# Patient Record
Sex: Female | Born: 1955
Health system: Southern US, Community
[De-identification: ages and names within clinical notes are randomized; demographics above are authoritative.]

## PROBLEM LIST (undated history)

## (undated) DIAGNOSIS — B009 Herpesviral infection, unspecified: Secondary | ICD-10-CM

## (undated) DIAGNOSIS — I10 Essential (primary) hypertension: Secondary | ICD-10-CM

## (undated) DIAGNOSIS — E039 Hypothyroidism, unspecified: Secondary | ICD-10-CM

## (undated) DIAGNOSIS — D219 Benign neoplasm of connective and other soft tissue, unspecified: Secondary | ICD-10-CM

## (undated) DIAGNOSIS — R0602 Shortness of breath: Secondary | ICD-10-CM

## (undated) DIAGNOSIS — E785 Hyperlipidemia, unspecified: Secondary | ICD-10-CM

## (undated) DIAGNOSIS — M1990 Unspecified osteoarthritis, unspecified site: Secondary | ICD-10-CM

## (undated) DIAGNOSIS — I219 Acute myocardial infarction, unspecified: Secondary | ICD-10-CM

## (undated) DIAGNOSIS — Z789 Other specified health status: Secondary | ICD-10-CM

## (undated) DIAGNOSIS — E079 Disorder of thyroid, unspecified: Secondary | ICD-10-CM

## (undated) DIAGNOSIS — Z0282 Encounter for adoption services: Secondary | ICD-10-CM

## (undated) DIAGNOSIS — I209 Angina pectoris, unspecified: Secondary | ICD-10-CM

## (undated) HISTORY — DX: Herpesviral infection, unspecified: B00.9

## (undated) HISTORY — DX: Benign neoplasm of connective and other soft tissue, unspecified: D21.9

## (undated) HISTORY — PX: BACK SURGERY: SHX140

## (undated) HISTORY — PX: OOPHORECTOMY: SHX86

## (undated) HISTORY — PX: KNEE SURGERY: SHX244

## (undated) HISTORY — DX: Essential (primary) hypertension: I10

## (undated) HISTORY — DX: Hyperlipidemia, unspecified: E78.5

## (undated) HISTORY — DX: Disorder of thyroid, unspecified: E07.9

## (undated) HISTORY — PX: CARDIAC CATHETERIZATION: SHX172

---

## 1999-09-27 ENCOUNTER — Other Ambulatory Visit: Admission: RE | Admit: 1999-09-27 | Discharge: 1999-09-27 | Payer: Self-pay | Admitting: Geriatric Medicine

## 1999-10-12 ENCOUNTER — Encounter: Payer: Self-pay | Admitting: Internal Medicine

## 1999-10-12 ENCOUNTER — Encounter: Admission: RE | Admit: 1999-10-12 | Discharge: 1999-10-12 | Payer: Self-pay | Admitting: Internal Medicine

## 1999-10-22 ENCOUNTER — Ambulatory Visit (HOSPITAL_COMMUNITY): Admission: RE | Admit: 1999-10-22 | Discharge: 1999-10-22 | Payer: Self-pay | Admitting: Internal Medicine

## 1999-10-22 ENCOUNTER — Encounter: Payer: Self-pay | Admitting: Internal Medicine

## 2002-04-18 ENCOUNTER — Encounter: Admission: RE | Admit: 2002-04-18 | Discharge: 2002-04-18 | Payer: Self-pay | Admitting: Geriatric Medicine

## 2002-04-18 ENCOUNTER — Encounter: Payer: Self-pay | Admitting: Geriatric Medicine

## 2002-10-24 ENCOUNTER — Encounter: Admission: RE | Admit: 2002-10-24 | Discharge: 2002-10-24 | Payer: Self-pay | Admitting: Internal Medicine

## 2002-10-24 ENCOUNTER — Encounter: Payer: Self-pay | Admitting: Anesthesiology

## 2002-11-17 ENCOUNTER — Ambulatory Visit (HOSPITAL_COMMUNITY): Admission: RE | Admit: 2002-11-17 | Discharge: 2002-11-17 | Payer: Self-pay | Admitting: Interventional Cardiology

## 2002-11-17 ENCOUNTER — Encounter: Payer: Self-pay | Admitting: Interventional Cardiology

## 2002-12-04 HISTORY — PX: ABDOMINAL HYSTERECTOMY: SHX81

## 2003-05-16 ENCOUNTER — Encounter: Payer: Self-pay | Admitting: Obstetrics and Gynecology

## 2003-05-16 ENCOUNTER — Encounter (INDEPENDENT_AMBULATORY_CARE_PROVIDER_SITE_OTHER): Payer: Self-pay | Admitting: Specialist

## 2003-05-16 ENCOUNTER — Inpatient Hospital Stay (HOSPITAL_COMMUNITY): Admission: AD | Admit: 2003-05-16 | Discharge: 2003-05-20 | Payer: Self-pay

## 2003-05-16 ENCOUNTER — Encounter (INDEPENDENT_AMBULATORY_CARE_PROVIDER_SITE_OTHER): Payer: Self-pay | Admitting: *Deleted

## 2003-05-16 ENCOUNTER — Encounter: Payer: Self-pay | Admitting: Emergency Medicine

## 2003-08-06 ENCOUNTER — Ambulatory Visit (HOSPITAL_COMMUNITY): Admission: RE | Admit: 2003-08-06 | Discharge: 2003-08-06 | Payer: Self-pay | Admitting: Obstetrics and Gynecology

## 2003-08-06 ENCOUNTER — Encounter: Payer: Self-pay | Admitting: Obstetrics and Gynecology

## 2004-10-12 ENCOUNTER — Other Ambulatory Visit: Admission: RE | Admit: 2004-10-12 | Discharge: 2004-10-12 | Payer: Self-pay | Admitting: Obstetrics and Gynecology

## 2004-12-29 ENCOUNTER — Inpatient Hospital Stay (HOSPITAL_BASED_OUTPATIENT_CLINIC_OR_DEPARTMENT_OTHER): Admission: RE | Admit: 2004-12-29 | Discharge: 2004-12-29 | Payer: Self-pay | Admitting: Interventional Cardiology

## 2005-11-29 ENCOUNTER — Ambulatory Visit (HOSPITAL_COMMUNITY): Admission: RE | Admit: 2005-11-29 | Discharge: 2005-11-29 | Payer: Self-pay | Admitting: Addiction Medicine

## 2005-12-14 ENCOUNTER — Other Ambulatory Visit: Admission: RE | Admit: 2005-12-14 | Discharge: 2005-12-14 | Payer: Self-pay | Admitting: Obstetrics and Gynecology

## 2007-02-08 ENCOUNTER — Inpatient Hospital Stay (HOSPITAL_COMMUNITY): Admission: AD | Admit: 2007-02-08 | Discharge: 2007-02-11 | Payer: Self-pay | Admitting: Interventional Cardiology

## 2007-11-08 ENCOUNTER — Other Ambulatory Visit: Admission: RE | Admit: 2007-11-08 | Discharge: 2007-11-08 | Payer: Self-pay | Admitting: Obstetrics and Gynecology

## 2007-11-29 ENCOUNTER — Encounter: Admission: RE | Admit: 2007-11-29 | Discharge: 2007-11-29 | Payer: Self-pay | Admitting: Obstetrics and Gynecology

## 2010-04-18 ENCOUNTER — Ambulatory Visit: Payer: Self-pay | Admitting: Obstetrics and Gynecology

## 2010-04-18 ENCOUNTER — Other Ambulatory Visit: Admission: RE | Admit: 2010-04-18 | Discharge: 2010-04-18 | Payer: Self-pay | Admitting: Obstetrics and Gynecology

## 2010-06-23 ENCOUNTER — Ambulatory Visit (HOSPITAL_COMMUNITY): Admission: RE | Admit: 2010-06-23 | Discharge: 2010-06-23 | Payer: Self-pay | Admitting: Gastroenterology

## 2011-02-03 ENCOUNTER — Other Ambulatory Visit: Payer: Self-pay | Admitting: Dermatology

## 2011-04-21 NOTE — Cardiovascular Report (Signed)
NAMEPIETRA, ZULUAGA NO.:  [id number]   MEDICAL RECORD NO.:  [id number]          PATIENT TYPE:  INP   LOCATION:  4704                         FACILITY:  [facility]   PHYSICIAN:  Lyn Records, M.D.   DATE OF BIRTH:  08-Jun-1956   DATE OF PROCEDURE:  02/11/2007  DATE OF DISCHARGE:                            CARDIAC CATHETERIZATION   INDICATIONS:  The patient has a history of prior anterior infarction due  to occlusion of diagonal branch treated with angioplasty in 1988.  There  was also a history of recent progressive angina and abnormal stress  test.  The stress test suggests a large area of diminished anterior wall  profusion with ST segment depression consistent with ischemia.  She has  been having chest pain responsive to sublingual nitroglycerin.   PROCEDURES PERFORMED:  1. Left heart cath.  2. Selective coronary angiography.  3. Left ventriculography.   DESCRIPTION OF PROCEDURE:  After informed consent, a 6-French sheath was  placed in the right femoral artery using the modified Seldinger  technique.  A 6-French A2 multipurpose catheter was used for hemodynamic  recordings, left ventriculography by hand injection and selective and  left and right coronary angiography.  Because of intense diffuse  coronary vasoconstriction noted on the first left coronary shot, 200 mcg  of intracoronary nitroglycerin was administered.  The patient tolerated  the procedure without complications.  Manual compression was used for  control of hemostasis.   RESULTS:  1. Hemodynamic data:      a.     Aortic pressure 115/78.      b.     Left ventricular pressure 115/26.   II:  Left ventriculography:  The left ventricle size and function are  normal.  EF is 60%.   III: Coronary angiography:    a.  Left main coronary artery:  Normal.    b.  Left anterior descending coronary artery:  Contains a 50-70%  eccentric mid vessel stenosis and a segmental eccentric  50% distal  stenosis.  The first diagonal, which was the site of previous PCI,  contains 50-60% narrowing.  The entire LAD was in severe diffuse  vasoconstriction prior to 200 mcg of intracoronary nitroglycerin.  After  intracoronary nitroglycerin  the vessel was as described.  Prior to the  nitroglycerin, the region of stenosis in the mid vessel was greater than  80%.    c.  Circumflex artery:  Minimal luminal irregularities.  The obtuse  marginal trifurcates on the left lateral wall.    d.  Right coronary:  The right coronary artery is a large vessel that  gives PDA and left ventricular branches, luminal irregularities are  noted.   CONCLUSIONS:  1. There is severe diffuse vasoconstriction throughout the entire left      coronary system, left anterior descending more involved than      circumflex.  The mid left anterior descending contains an eccentric      50-70% stenosis after intracoronary nitroglycerin.  Prior to the      intracoronary nitroglycerin, the stenosis was greater than 80%.  2. Minimal luminal irregularities noted in  the circumflex and right      coronary.  3. Normal left ventricular function.   PLAN:  1. Add long-acting nitrate therapy.  Intensify Statin therapy.  2. Decreased beta blocker dose.  3. Add calcium channel blocker.  4. If continued chest discomfort, consider flow wire or IVUS or      empiric LAD PCI.      Lyn Records, M.D.  Electronically Signed     HWS/MEDQ  D:  02/11/2007  T:  02/11/2007  Job:  [id number]   cc:   Hal T. Stoneking, M.D.

## 2011-04-21 NOTE — H&P (Signed)
NAMEDRESDEN, AMENT NO.:  [id number]   MEDICAL RECORD NO.:  [id number]          PATIENT TYPE:  INP   LOCATION:  4704                         FACILITY:  [facility]   PHYSICIAN:  Lyn Records, M.D.   DATE OF BIRTH:  08-Oct-1956   DATE OF ADMISSION:  02/08/2007  DATE OF DISCHARGE:                              HISTORY & PHYSICAL   PRIMARY CARE PHYSICIAN:  Dr. Merlene Laughter.   CARDIOLOGIST:  Verdis Prime, MD.   CHIEF COMPLAINT:  Chest pain consistent with unstable angina.   HISTORY OF PRESENT ILLNESS:  [name] is a 55 year old Caucasian  female with a known history of a prior anterior wall MI secondary to a  diagonal one occlusion status post PTCA to the diagonal one  approximately 20 years ago.  She is status post repeat cardiac  catheterization in January 2006 revealing normal LV systolic function  with an EF of 50%.  Irregularities in the right coronary and mid-LAD  diagonal with up to 30-40% narrowing were noted in the proximal left  anterior descending as well as 50% plus/minus 10% narrowing in the  proximal portion of the first diagonal.  Circumflex was normal.  She was  directly admitted to the [facility] on today secondary to a 2-3  weeks history of exertional, substernal chest burning and tightness with  radiation to the bilateral clavicular area.  She denies shortness of  breath and diaphoresis; however, admits to nausea, vomiting, and  dizziness on yesterday.  The pain is relieved with sublingual  nitroglycerin.  On yesterday, she underwent an outpatient stress  Cardiolite at [facility] which was positive for angina and EKG  changes during exercise.  Cardiac catheterization with possible PCI has  been scheduled for Monday to be performed by Dr. Verdis Prime.  The  patient currently admits to chest burning and tightness which is similar  to the quality and character of previous anginal episodes.   PAST MEDICAL HISTORY:  1.  Hypertension.  2. Dyslipidemia.  3. Hypothyroidism.  4. Overactive bladder.  5. History of anterior wall MI secondary to diagonal one occlusion,      status post PTCA to the diagonal one in December of 1988.  6. Status post total abdominal hysterectomy.  7. Status post left knee surgery.   ALLERGIES:  MORPHINE SULFATE.   MEDICATIONS:  1. Synthroid 50 mcg daily.  2. Oxybutynin 15 mg daily.  3. Lipitor 40 mg 1/2 tablet daily.  4. Atenolol 25 mg 1/2 tablet twice daily.  5. Aspirin 325 mg daily.  6. Plavix 75 mg daily.  7. Sublingual nitroglycerin 0.4 mg as needed for chest pain.  8. Estradiol patch weekly.   FAMILY HISTORY:  The patient was adopted, however, has been in contact  with her biological parents and denies any significance for early  coronary artery disease.   SOCIAL HISTORY:  Divorced with two adult children.  Lives alone.  She is  employed as a Occupational psychologist.  She admits to tobacco  use, smoking one pack per day for the past 30 years.  She denies alcohol  and  illicit drug use.   REVIEW OF SYSTEMS:  All other systems reviewed and are negative other  than what is stated in the HPI.   PHYSICAL EXAM:  GENERAL:  A 55 year old female, pleasant and  cooperative, NAD.  VITALS:  Temperature 98.2, blood pressure 155/101, pulse 52,  respirations 20, O2 saturations 98% on room air.  Height 67 inches,  weight 154.9 pounds.  HEENT: Unremarkable.  Neck: Supple without JVD or carotid bruits, bilaterally.  Carotid  upstrokes 2+.  PULMONARY:  Breath sounds are equal and clear to auscultation  bilaterally.  No use of accessory muscles.  CV:  Regular rate and rhythm.  Normal S1-S2 without murmurs, gallops,  clicks or rubs.  ABDOMEN:  Soft, nontender, nondistended with active bowel sounds.  No  masses, organomegaly, or bilateral bruits.  EXTREMITIES:  No peripheral edema.  DP and femoral pulses 2+/2,  bilaterally.  GROIN:  No femoral bruits, bilaterally.   NEURO:  No focal motor or sensory deficits.  PSYCH:  Normal mood and affect.  BACK:  No kyphosis or scoliosis.   LABORATORY DATA:  BMET, CBC, BNP, PT, INR, PTT, magnesium, EKG and  portable chest x-ray are pending.   ASSESSMENT:  1. Chest pain consistent with unstable angina.  2. Prior history of anterior wall myocardial infarction secondary to      diagonal one occlusion status post percutaneous transluminal      coronary angioplasty to the diagonal one in December 1988.  3. Status post cardiac catheterization with mild irregularities, none      greater than 50% January 2006.  4. Hypertension.  5. Dyslipidemia.  6. Hypothyroidism.  7. Overactive bladder.  8. Status post total abdominal hysterectomy.  9. Status post left knee surgery.   PLAN:  1. Rule out MI.  Cardiac panel including troponin-I q.8h. x3.  First      set to be drawn now.  2. Cardiac catheterization scheduled for Monday, February 11, 2007 at      7:30 a.m. to be performed by Dr. Verdis Prime.  The cath procedure,      risks and potential complications have been explained to the      patient in detail by Dr. Katrinka Blazing including CVA, MI, death, IV      contrast dye allergy, renal insufficiency and vascular      complications including bleeding, hematoma and pseudoaneurysm.  The      patient admits to full understanding of the information and wishes      to proceed.  Pre cath orders are set to chart.  3. Daily BMET, CBC and EKG x2.  4. Subcutaneous Lovenox every 12 hours per pharmacy protocol as soon      as possible.  5. Nitroglycerin transdermal patch 0.2 mg per hour per day.  On in the      a.m. and off in the p.m.  Start as soon as possible.  6. If serial cardiac enzymes are positive for troponin I, start IV      Integrilin per pharmacy protocol, single bolus.  7. Change atenolol to 25 mg twice daily with the first dose to be      given now. 8. Fasting lipid panel in a.m.  9. Smoking cessation consult.  10.Continue  home medications with the exception of a change to      atenolol and Lipitor and start with 40 mg daily.  11.Initiate cardiology p.r.n. orders.  A physical backup to wear at      the top  about starting the nitroglycerin transdermal patch is weak      a few days at a sentences a started soon as possible.  12.Add lorazepam 0.5 mg three times daily as needed for anxiety.  13.The patient was seen, interviewed and examined by Dr. Verdis Prime      who participated in the medical decision making and plan of care.      Tylene Fantasia, [place]      Lyn Records, M.D.  Electronically Signed    RDM/MEDQ  D:  02/08/2007  T:  02/09/2007  Job:  [id number]   cc:   Hal T. Stoneking, M.D.

## 2011-04-21 NOTE — Cardiovascular Report (Signed)
NAME:  Sonia Robertson, Sonia Robertson                       ACCOUNT NO.:  [id number]   MEDICAL RECORD NO.:  [id number]                   PATIENT TYPE:  OIB   LOCATION:  2858                                 FACILITY:  [facility]   PHYSICIAN:  Lesleigh Noe, M.D.            DATE OF BIRTH:  1956/04/25   DATE OF PROCEDURE:  11/17/2002  DATE OF DISCHARGE:                              CARDIAC CATHETERIZATION   INDICATIONS FOR PROCEDURE:  Recent recurring episodes of epigastric and  lower substernal discomfort.  Recent Cardiolite demonstrated mild  abnormalities in the mid anterior wall that were fixed.  The patient has a  prior history of myocardial infarction in 1988 with ventricular  fibrillation.  Angioplasty was performed at that time and she has been  relatively asymptomatic since then.   PROCEDURE PERFORMED:  1. Left heart catheterization.  2. Selective coronary angiogram.  3. Left ventriculography.  4. Intravascular ultrasound of the right coronary as entry for the CETP IVUS     study through [facility].   DESCRIPTION OF PROCEDURE:  After informed consent, a 6 French sheath was  started in the right femoral artery using a modified Seldinger technique. A  6 French A2 multipurpose catheter was used for hemodynamic recordings, left  ventriculography by hand injection, and selective left and right coronary  angiography.  No significant high-grade obstruction was found in the LAD,  circumflex or right coronary.  She had consented to participate in the CETP  intravascular ultrasound.  She was enrolled in this study and we chose to  perform intravascular ultrasound on the right coronary. A 6 [place] guide  catheter was used.  A total of 3000 units of IV heparin was administered.  The ACT was documented to be greater than 275 seconds.  A single pass  intravascular ultrasound run was performed using the Scimed Atlantis pro  intravascular ultrasound  catheter.  No complications occurred.  The patient  was taken to the holding area where she will be held until the sheath is  removed after the ACT drops below 175 seconds.   RESULTS:  I:  HEMODYNAMIC DATA:  a.  Aortic pressure 148/76.  b.  Left ventricular pressure 148/13.   II:  LEFT VENTRICULOGRAPHY:  There is mild anterior wall hypokinesis.  Overall LV function is normal.  The EF is 50%.   III:  SELECTIVE CORONARY ANGIOGRAPHY:  a.  Left main coronary artery:  The left main coronary artery is free of any  significant obstruction.  b.  Left anterior descending coronary:  The LAD is a relatively large vessel  that contains rather diffuse proximal and mid vessel irregularities.  There  is 50% narrowing in the LAD beyond the origin of the first diagonal.  Irregularities are noted also in the mid LAD.  The LAD reaches the left  ventricle apex.  The first diagonal is almost a twin size vessel that arises  very proximally from the LAD and also contains luminal irregularities.  No  high-grade obstruction is noted in either the LAD or the diagonal.  c.  Circumflex artery:  The circumflex artery is large.  It gives origin to  three obtuse marginal branches.  Irregularities are noted within the  circumflex and the third obtuse marginal which is the dominant obtuse  marginal vessels but no significant obstruction is seen.  d.  Right coronary:  The right coronary artery is a large vessel that gives  origin to a large PDA and left ventricle branch.  The PDA reaches the left  ventricular apex and slightly wraps around the apex.  Luminal irregularities  are noted in the mid RCA with up to 25-30% narrowing noted.   IV:  INTRAVASCULAR ULTRASOUND OF THE RIGHT CORONARY:  This was performed  without difficulty.  Scattered plaquing was noted throughout the mid and  proximal third of the right coronary which was the region that ultrasound  was performed on.   CONCLUSIONS:  1. Coronary atherosclerotic  heart disease with 25-50% narrowing in the     proximal left anterior descending.  The diagonal, which is the infarct     related vessel from prior infarction 15 years ago is widely patent.  The     right coronary and circumflex contain luminal irregularities.  2. Overall normal left ventricular function with ejection fraction of at     least 55%.  There is mild anterior wall hypokinesis.  3. Successful right coronary intravascular ultrasound as entry criteria for     the CEPT intravascular ultrasound research study at [facility].   PLAN:  Remove sheath later today.  Discharge home later today.  Recent chest  discomfort is not felt to be ischemic. Encourage risk factor modification.                                                 Lesleigh Noe, M.D.    HWS/MEDQ  D:  11/17/2002  T:  11/17/2002  Job:  [id number]   cc:   Hal T. Stoneking, M.D.  [address] [place], [place] [address]  Fax: [phone]

## 2011-04-21 NOTE — H&P (Signed)
NAME:  Sonia Robertson, Sonia Robertson                       ACCOUNT NO.:  [id number]   MEDICAL RECORD NO.:  [id number]                   PATIENT TYPE:  MAT   LOCATION:  MATC                                 FACILITY:  [facility]   PHYSICIAN:  Daniel L. Eda Paschal, M.D.           DATE OF BIRTH:  06/25/1956   DATE OF ADMISSION:  05/16/2003  DATE OF DISCHARGE:                                HISTORY & PHYSICAL   CHIEF COMPLAINT:  Severe pelvic pain.   HISTORY OF PRESENT ILLNESS:  The patient is a 55 year old, gravida 3, para  2, AB 1 who woke up on Thursday morning with severe lower pelvic pain.  She  has used ibuprofen for two days with no results.  The pain is sharp rather  then dull.  It is not aggravated by anything except for sitting.  It is not  made worse by anything else.  She has had no nausea, vomiting, diarrhea.  She has had no change in her bowel habits.  She has had no change in urinary  habits.  Finally this morning after two days of suffering she went to [facility].  She was in so much pain that she was treated with  increments of morphine up to 14 mg IV.  She had a bunch of studies done  including a normal CBC, Comprehensive Metabolic Panel and urinalysis and  then she went ahead and had a CT of her abdomen and pelvis which showed a 9  cm mass slightly to the left of the midline and a little bit of free fluid.  The concern of the radiologist was either a pelvic abscess or possibly an  adnexal mass.  She was transferred to [facility] for more definitive  care as it was felt that her pain was GYN in origin.  At [facility]  she went ahead and had an ultrasound.  Ultrasound showed a heterogenous mass  in the uterus at 7.4 x 5.6 x 6.9 cm, most consistent with a submucous  fibroid.  She also had a discrete exophytic fibroid in the left side of her  uterus that was approximately 5 cm.  She had a normal left ovary which is  the side where she has the most pain and her right  ovary could not be seen.  On CT the right ovary was seen and was normal and it was felt that possibly  her left ovary was normal as well.  The patient has been told by Dr.  Pete Glatter, who is her internist, that she is menopausal.  She had an  elevated FSH eight months ago and has had no bleeding in the last eight  months and is not bleeding now.   PAST MEDICAL HISTORY:  1. Patient had an MI in 29.  She is a heavy smoker.  On the third day of     the MI she underwent angioplasty by Dr. Garnette Scheuermann.  2.  She also has recently been diagnosed with hypothyroidism and is on 25 mcg     of Synthroid.   MEDICATIONS:  1. Synthroid.  2. Lipitor.  She thinks she is on 80 mg as part of a study.  3. In addition, she is on Ditropan XL 50 mg daily for urgency incontinence.   ALLERGIES:  She is allergic to no drugs.   FAMILY HISTORY:  Noncontributory.   SOCIAL HISTORY:  Patient is a heavy smoker.   REVIEW OF SYSTEMS:  HEENT:  Negative.  CARDIAC:  See above.  GI:  Negative.  GU:  See above.  NEUROMUSCULAR:  Negative.  ENDOCRINE:  Negative.   PHYSICAL EXAMINATION:  GENERAL:  Patient is a well-developed, well-nourished  female in acute abdominal discomfort.  She has trouble sitting.  She has  trouble lying flat because of the amount of pain.  VITAL SIGNS:  Her blood pressure is 121/80, her pulse is 55, her  respirations are 18, her temp is 98.5.  HEENT:  All within normal limits.  NECK:  Supple.  Trachea in the midline.  Thyroid is not enlarged.  LUNGS:  Clear to P&A.  HEART:  No thrills, heaves or murmurs.  BREASTS:  No masses.  ABDOMEN:  Reveals diffuse guarding with actual peritoneal rebound most  pronounced in the left lower quadrant.  PELVIC:  Externals within normal limits.  BUS is within normal limits.  Vaginal exam is normal.  Cervix is clean.  Uterus is enlarged by a large  mass of about 15 weeks size with pain mostly on the left side.  Rectovaginal  is negative.  EXTREMITIES:  Within  normal limits.   ADMISSION IMPRESSION:  Pelvic mass with acute abdominal pain, probable  degenerating fibroid.   PLAN:  I think the patient is probably going to need to be explored.  Dr.  Benjaman Kindler is going to come see her to be sure that he does not think there  is a medical explanation for the above and also to be sure that he does not  think she is having an acute MI due to an EKG done today showing a septal  infarct.  Decision will be made after the above.                                                Daniel L. Eda Paschal, M.D.    Tonette Bihari  D:  05/16/2003  T:  05/16/2003  Job:  [id number]

## 2011-04-21 NOTE — Discharge Summary (Signed)
NAMECYLINDA, [name]             ACCOUNT NO.:  [id number]   MEDICAL RECORD NO.:  [id number]          PATIENT TYPE:  INP   LOCATION:  4704                         FACILITY:  [facility]   PHYSICIAN:  Lyn Records, M.D.   DATE OF BIRTH:  1956-08-10   DATE OF ADMISSION:  02/08/2007  DATE OF DISCHARGE:                               DISCHARGE SUMMARY   ADMISSION DIAGNOSIS:  Chest pain consistent with unstable angina.   DISCHARGE DIAGNOSIS:  1. Chest pain consistent with unstable angina status post cardiac      catheterization with nonobstructive coronary arteries possibly      secondary to vasospasm with a 50-70% eccentric mid left anterior      descending.  Normal right coronary artery,  circumflex, and left      main, 02/11/2007.  2. Normal left ventricular systolic function with an ejection fraction      of 60%.  3. Status post acute wall myocardial infarction secondary to diagonal      I no occlusion, status post percutaneous transluminal coronary      angioplasty to the diagonal I in December of 1988.  4. Hypertension.  5. Dyslipidemia.  6. Hypothyroidism.  7. Overactive bladder.  8. Status post total abdominal hysterectomy.  9. Status post left knee surgery.   PROCEDURES:  1. Left heart catheterization.  2. Left ventriculogram.  3. Coronary angiography, 02/11/2007.   CONSULTATION:  None.   HOSPITAL COURSE:  [name] is a 55 year old Caucasian female with  no known history of coronary artery disease.  She is status post an  acute wall MI secondary to diagonal I occlusion status post PTCA to the  diagonal one in 1988.  Repeat cardiac catheterization in 2006 revealed  irregularities in the right coronary and mid LAD diagonal with up to 30-  40% narrowing in the proximal LAD as well as 50%, plus or minus 10%,  narrowing in the proximal portion of the first diagonal.  The circumflex  was normal.  On Friday, 02/08/2007, she was directly admitted to the  [facility]  secondary to a 2-3 week history of exertional chest  discomfort with radiation to the bilateral clavicular area.  The patient  had an abnormal stress test that was positive for angina and EKG changes  during exercise. Point of care enzymes were negative x2 with a peak  troponin of 0.02.  Chest x-ray was negative for acute cardiopulmonary  process.  Fasting lipid panel was normal.  On 02/11/2007, she underwent  a repeat cardiac catheterization that revealed 50-70% eccentric mid LAD  stenosis as well as 50% distal LAD stenosis.  The circumflex, RCA, and  left main were normal.  The patient was noted to have normal LV systolic  function with an EF of 60% via cardiac catheterization.  The procedure  was performed by Dr. Verdis Prime and was well-tolerated by the patient  with an estimated minimal blood loss.  She was observed on bedrest for  four hours with continuation of intravenous hydration.  Her groin was  stable without evidence of bleeding, oozing, hematoma, or  pseudoaneurysm.  She  was discharged to home today in stable condition  without further complaints of angina.  Norvasc 5 mg daily as well as  Imdur 30 mg daily were added to the medical regimen upon discharge.  The  patient was seen and examined by Dr. Verdis Prime who was in agreement  with the patient's discharge on today.   LABORATORY DATA:  Serial cardiac enzymes, CK total 97, 83, and 77, and  CK-MB 3.7, 2.9, and 2.8, respectively.  Troponin-I 0.02 x2 and 0.03.  BNP 40.  PT 13.9, INR 1.1, PTT 41.  White blood count 5.3, hemoglobin  13.2, hematocrit 37.7, platelets 167,000.  Sodium 143, potassium 3.9,  chloride 109, CO2 27, glucose 91, BUN 11, creatinine 0.68, magnesium  2.4.   X-RAYS:  As stated in the hospital course.   EKG:  Twelve-lead EKG, 02/09/2007, revealed marked sinus bradycardia  with a ventricular rate of 44 beats per minute.  There was no evidence  of acute ST-segment/T-wave changes.   CONDITION ON DISCHARGE:   After bedrest, stable.   DISCHARGE MEDICATIONS:  1. Atenolol 25 mg daily.  2. Aspirin 325 mg daily.  3. Plavix 75 mg daily.  4. Lipitor 40 mg 1/2 tablet daily.  5. Sublingual nitroglycerin 0.4 mg as needed for chest pain.  6. Synthroid 50 mcg daily.  7. Norvasc 5 mg daily.  This represents a new prescription.  A      prescription was given with refills.  8. Oxybutynin 15 mg daily.  9. Estradiol patch 0.05 mg weekly.  10.Imdur 30 mg daily.  This represents a new prescription.  A      prescription was given with refills.   DISCHARGE INSTRUCTIONS:  1. No lifting greater than 10 pounds for one week.  2. No driving for one day.  3. Continue a low-sodium, low-fat, and low-cholesterol diet.  4. Avoid straining.  5. Bathe groin area with warm soap and water.  Do not scrub area.  6. Contact physician's office for recurrent chest pain or shortness of      breath.   FOLLOW-UP ARRANGEMENTS:  A groin check has been scheduled with a  physician extender at [facility] on 02/18/2007 at 11:30 a.m..  The  patient is scheduled to call [phone] if she is unable to keep this  scheduled appointment.      Tylene Fantasia, [place]      Lyn Records, M.D.  Electronically Signed    RDM/MEDQ  D:  02/11/2007  T:  02/11/2007  Job:  [id number]   cc:   Lyn Records, M.D.  Hal T. Stoneking, M.D.

## 2011-04-21 NOTE — Discharge Summary (Signed)
   NAME:  Sonia Robertson, Sonia Robertson                       ACCOUNT NO.:  [id number]   MEDICAL RECORD NO.:  [id number]                   PATIENT TYPE:  INP   LOCATION:  9325                                 [facility]   PHYSICIAN:  Daniel L. Eda Paschal, M.D.           DATE OF BIRTH:  [birth year]   DATE OF ADMISSION:  05/16/2003  DATE OF DISCHARGE:  05/20/2003                                 DISCHARGE SUMMARY   DISCHARGE DIAGNOSIS:  Severe pelvic pain.   PROCEDURE:  Total abdominal hysterectomy with bilateral salpingo-  oophorectomy.   HISTORY OF PRESENT ILLNESS:  This is a 55 year old, gravida 3, para 2, AB 1,  who woke up with severe lower abdominal pain that she used ibuprofen for  several days without results or relief.  The pain was so severe she went to  [facility] emergency room where they did a series of laboratories that were normal  and a CT of her abdomen which showed a 9 cm mass slightly to the left of the  midline.  She was transferred to [facility].   An ultrasound showed a heterogenous mass in the uterus 7.4 x 5.6 x 6.9 most  consistent with a fibroid.  She is menopausal, has had no menses in eight  months.   PAST MEDICAL HISTORY:  She did have a MI in 1988, and had angioplasty.  She  is also hypothyroid on Synthroid and is on Lipitor 80 mg daily and Ditropan  XL 50 daily.   HOSPITAL COURSE:  The patient was admitted.  She had a TAH with bilateral  salpingo-oophorectomy.  Findings included a fibroid measuring to 7 cm.  No  evidence of malignancy was found.  Postoperatively, she remained afebrile.  She did have some vaginal bleeding that did resolve.   She was discharged home on postoperative day #4 with instructions to return  to the office for staple removal.   Her urine appeared to be bloody.  It was more from the vagina.  A Foley was  placed.  Clean catch urine and culture were obtained and were negative.  Urine was clear from the Foley.   She was discharged home in  satisfactory condition on her fourth  postoperative day.   LABORATORY DATA:  White count 7.6, hemoglobin 12.1, hematocrit 35.5 and  platelets 172.   DISPOSITION:  The patient was to follow up in the office in three days for  suture removal, staple removal.  A prescription for Tylox p.r.n. was given.  Instructions were reviewed.     Davonna Belling. Young, N.P.                      Daniel L. Eda Paschal, M.D.    Providence Lanius  D:  06/05/2003  T:  06/05/2003  Job:  [id number]

## 2011-04-21 NOTE — Cardiovascular Report (Signed)
NAMEDARRIONA, Sonia Robertson             ACCOUNT NO.:  [id number]   MEDICAL RECORD NO.:  [id number]          PATIENT TYPE:  OIB   LOCATION:  NA                           FACILITY:  [facility]   PHYSICIAN:  Lyn Records III, M.D.DATE OF BIRTH:  [birth year]   DATE OF PROCEDURE:  12/29/2004  DATE OF DISCHARGE:                              CARDIAC CATHETERIZATION   INDICATIONS:  Follow-up study. The patient in to have right coronary  intravascular ultrasound performed.   PROCEDURE PERFORMED:  1.  Left heart cath.  2.  Selective coronary angio.  3.  Left ventriculography.  4.  Right coronary intravascular ultrasound.   DESCRIPTION OF PROCEDURE:  After informed consent, a 6-French sheath was  placed in the right femoral artery using a modified Seldinger technique. A 6-  [place] A2 multipurpose catheter was then used for hemodynamic recordings,  left ventriculography by hand injection, selective left coronary  angiography, and right coronary angiography. We also performed right  coronary angiography with a side-hole Medtronic monitor guide catheter. 100  mcg of intracoronary nitroglycerin was administered prior to coronary  angiography. We then performed intravascular ultrasound on the right  coronary using a 0.014 Zinger medium weight Medtronic wire and a Launcher JR-  4 side-hole catheter. The patient received 5000 units of IV heparin and ACT  was documented to be slightly above 300 before wide manipulation was begun.  An Atlantis probe Sci-Med system was used. Post IVUS, we gave 100 mcg of  intracoronary nitroglycerin and the artery appeared widely patent without  any evidence of dissection or mechanical injury.  Postprocedure, a  sheathogram was performed that demonstrated acceptable anatomy for Angio-  Seal arteriotomy closure. This was performed without consequence.   RESULTS:  1.  HEMODYNAMIC DATA:      1.  Aortic pressure 136/72.      2.  Left ventricle pressure 148/10.  2.  LEFT  VENTRICULOGRAPHY:  Left renal cavity size and systolic function are      normal.  EF is 50% , no MR.  3.  CORONARY ANGIOGRAPHY.      1.  Left main coronary.  The left main coronary artery is normal.      2.  Left anterior descending coronary:  The LAD is large vessel that is          nearly codominant with a large diagonal that also arises from it          proximally. The LAD reaches the left ventricular apex but does not          wrap around.  There are luminal  irregularities noted in proximal          LAD with up to 20 30% narrowing. The diagonal contains proximal and          ostial  50-60% narrowing but no high-grade obstruction.      3.  CIRCUMFLEX ARTERY:  Circumflex is a large trifurcating vessel that          contains minimal luminal  irregularities and also gives arterial          supply  to the ventricular apex. Circumflex is normal.      4.  RIGHT CORONARY:  The right coronary is a dominant vessel.  It gives          origin to a large PDA that comes to the left ventricular apex and          also a large left triple branch. Scattered luminal irregularities          are noted.          1.  INTRAVASCULAR ULTRASOUND:  The Atlantis catheter was placed              distally without difficulty. We began IVUSing just beyond the              ostium of the PDA and the pullback came all the way to the near              ostium of the right coronary.   CONCLUSION:  1.  Irregularities in the right coronary and mild left anterior descending      diagonal disease with up to 30-40% narrowing in the proximal left      anterior descending and 50% plus/minus 10% narrowing in the proximal      portion of the first diagonal. The circumflex is normal.  2.  Overall normal left ventricular function.  3.  Successful Angio-Seal.   PLAN:  Home later today. Continue usual medications, resume Lipitor 80 mg  per day, monitor lab work as OP.      HWS/MEDQ  D:  12/29/2004  T:  12/29/2004  Job:   [id number]   cc:   Hal T. Stoneking, M.D.  [address]  [place], [place] [address]  Fax: [phone]    [facility]

## 2011-04-21 NOTE — Op Note (Signed)
NAME:  Sonia Robertson, Sonia Robertson                       ACCOUNT NO.:  [id number]   MEDICAL RECORD NO.:  [id number]                   PATIENT TYPE:  INP   LOCATION:  9325                                 [facility]ACILITY[facility]   PHYSICIAN:  Daniel L. Eda Paschal, M.D.           DATE OF BIRTH:  [birth year]   DATE OF PROCEDURE:  05/16/2003  DATE OF DISCHARGE:                                 OPERATIVE REPORT   PREOPERATIVE DIAGNOSES:  1. Severe pelvic pain.  2. Degenerative fibroid suspected.   POSTOPERATIVE DIAGNOSIS:  Degenerative hemorrhagic fibroid.   OPERATION:  Total abdominal hysterectomy, bilateral salpingo-oophorectomy.   SURGEON:  Daniel L. Eda Paschal, M.D.   FIRST ASSISTANT:  Ivor Costa. Farrel Gobble, M.D.   FINDINGS:  The patient's uterus was enlarged by two fibroids.  The larger  one was about 7 or 8 cm.  The smaller one was about 4 cm.  The larger one  when you cut into the surface, which the pathologist did, showed hemorrhage  and a degenerated fibroid.  The patient's left ovary and tube were normal.  The ovary was atrophic and postmenopausal.  The patient's right ovary had a  surface lesion that the pathologist felt was a benign papillary adenoma, but  frozen section was not done.  The fallopian tube was normal.  The ovary  itself looked atrophic.  The ileocecal junction was identified.  The  patient's appendix was normal.  The cecum was somewhat distended, as was the  sigmoid, as if the patient had had some large bowel distention.  The small  bowel was run, and there was no Meckel's.  The peritoneal cavity was free of  any other disease.  The omentum was normal.  The liver appeared normal.  There was no peritoneal studding at all.   PROCEDURE:  After adequate general endotracheal anesthesia, the patient was  placed in a supine position and prepped and draped in the usual sterile  manner.  She was given 1 g of Cefotan IV.  Pulsatile stockings were placed.  A Foley catheter was placed.  A  vertical midline incision was made.  The  fascia was opened vertically, as was the peritoneum.  Peritoneal washings  were obtained.  Subcutaneous bleeders were bovied.  The round ligaments were  then grasped, bovied, and cut.  The vesicouterine fold of peritoneum was  developed.  The retroperitoneal space was entered, the ureters were  identified, and the IP ligaments bilaterally were clamped, cut, and doubly  suture ligated with #1 chromic catgut.  The posterior peritoneum was sharply  dissected free.  The uterine arteries were clamped, cut, and doubly suture  ligated with #1 chromic catgut.  The parametrium was taken down in  successive bites with clamping, cutting, and suture ligating with #1 chromic  catgut.  The cervicovaginal junction was identified and entered.  The  uterus, ovaries, and tubes were delivered intact, sent to the pathologist,  who made a diagnosis of  hemorrhagic degenerating fibroid and no malignant  adnexal masses.  The vaginal cuff angles were sutured with #1 chromic  catgut, incorporating the uterosacral ligaments and cardinal ligaments for  good vault support, and then the cuff was whipstitched with 0 Vicryl.  Two  sponge, needle, and instrument counts were correct.  The peritoneal cavity  was copiously irrigated, and then the peritoneum and the fascia were closed  with two running 0 PDS, meeting  in the midline.  The superfascial space was then copiously irrigated and the  skin was closed with staples.  Estimated blood loss for the entire procedure  was 250 mL and none replaced.  The patient tolerated the procedure well and  left the operating room in satisfactory condition draining clear urine from  her Foley catheter.                                               Daniel L. Eda Paschal, M.D.    Tonette Bihari  D:  05/16/2003  T:  05/17/2003  Job:  [id number]

## 2011-08-18 ENCOUNTER — Other Ambulatory Visit: Payer: Self-pay | Admitting: *Deleted

## 2011-08-18 MED ORDER — ESTRADIOL 0.075 MG/24HR TD PTWK: 1.0000 | MEDICATED_PATCH | TRANSDERMAL | Status: DC

## 2011-08-18 NOTE — Telephone Encounter (Signed)
Pt requested a rf on her estradiol patch. She tried to make an appt and was told that Oct was pretty booked and could not schedule due to her availability and told to call back for Nov. She will and Rx taken care of.

## 2011-10-04 ENCOUNTER — Encounter: Payer: Self-pay | Admitting: Gynecology

## 2011-10-04 DIAGNOSIS — D219 Benign neoplasm of connective and other soft tissue, unspecified: Secondary | ICD-10-CM | POA: Insufficient documentation

## 2011-10-13 ENCOUNTER — Ambulatory Visit (INDEPENDENT_AMBULATORY_CARE_PROVIDER_SITE_OTHER): Payer: BC Managed Care – PPO | Admitting: Obstetrics and Gynecology

## 2011-10-13 ENCOUNTER — Other Ambulatory Visit (HOSPITAL_COMMUNITY)
Admission: RE | Admit: 2011-10-13 | Discharge: 2011-10-13 | Disposition: A | Discharge status: Home / Self Care | Payer: BC Managed Care – PPO | Source: Ambulatory Visit | Attending: Obstetrics and Gynecology | Admitting: Obstetrics and Gynecology

## 2011-10-13 ENCOUNTER — Encounter: Payer: Self-pay | Admitting: Obstetrics and Gynecology

## 2011-10-13 VITALS — BP 120/74 | Ht 67.0 in | Wt 166.0 lb

## 2011-10-13 DIAGNOSIS — Z8262 Family history of osteoporosis: Secondary | ICD-10-CM

## 2011-10-13 DIAGNOSIS — Z01419 Encounter for gynecological examination (general) (routine) without abnormal findings: Secondary | ICD-10-CM

## 2011-10-13 DIAGNOSIS — Z780 Asymptomatic menopausal state: Secondary | ICD-10-CM

## 2011-10-13 DIAGNOSIS — N951 Menopausal and female climacteric states: Secondary | ICD-10-CM

## 2011-10-13 MED ORDER — ESTRADIOL 0.075 MG/24HR TD PTWK: 1.0000 | MEDICATED_PATCH | TRANSDERMAL | Status: DC

## 2011-10-13 NOTE — Progress Notes (Signed)
Patient came to see me today for an annual GYN exam. She remains happy on her estrogen patch. We increased the dose last year she says is working much better. She's having no vaginal bleeding. She is having no pelvic pain. She has no vaginal dryness. She is way overdue for mammogram. She's never had a bone density. She suspects her mother had osteoporosis.  HEENT: Within normal limits. Kennon Portela present Neck: No masses. Supraclavicular lymph nodes: Not enlarged. Breasts: Examined in both sitting and lying position. Symmetrical without skin changes or masses. Abdomen: Soft no masses guarding or rebound. No hernias. Pelvic: External within normal limits. BUS within normal limits. Vaginal examination shows good estrogen effect, no cystocele enterocele or rectocele. Cervix and uterus absent. Adnexa within normal limits. Rectovaginal confirmatory. Extremities within normal limits.  Assessment: Normal GYN exam. Menopausal symptoms.  Plan: Continue Climara patch. Mammogram. Bone density. Told her I thought we could just do a breast exam next year.

## 2011-11-22 ENCOUNTER — Ambulatory Visit (INDEPENDENT_AMBULATORY_CARE_PROVIDER_SITE_OTHER): Payer: BC Managed Care – PPO

## 2011-11-22 DIAGNOSIS — Z8262 Family history of osteoporosis: Secondary | ICD-10-CM

## 2011-11-22 DIAGNOSIS — M8580 Other specified disorders of bone density and structure, unspecified site: Secondary | ICD-10-CM

## 2011-11-22 DIAGNOSIS — Z13820 Encounter for screening for osteoporosis: Secondary | ICD-10-CM

## 2011-12-28 ENCOUNTER — Other Ambulatory Visit: Payer: Self-pay | Admitting: *Deleted

## 2011-12-28 MED ORDER — ESTRADIOL 0.075 MG/24HR TD PTWK: 1.0000 | MEDICATED_PATCH | TRANSDERMAL | Status: DC

## 2011-12-28 NOTE — Progress Notes (Signed)
90 day rx request was faxed by [employer].

## 2012-04-09 ENCOUNTER — Other Ambulatory Visit: Payer: Self-pay | Admitting: Neurosurgery

## 2012-04-23 ENCOUNTER — Encounter (HOSPITAL_COMMUNITY): Payer: Self-pay | Admitting: Pharmacy Technician

## 2012-04-24 ENCOUNTER — Inpatient Hospital Stay (HOSPITAL_COMMUNITY): Admission: RE | Admit: 2012-04-24 | Discharge: 2012-04-24 | Payer: BC Managed Care – PPO | Source: Ambulatory Visit

## 2012-04-24 ENCOUNTER — Encounter (HOSPITAL_COMMUNITY): Payer: Self-pay

## 2012-04-24 HISTORY — DX: Hypothyroidism, unspecified: E03.9

## 2012-04-24 HISTORY — DX: Angina pectoris, unspecified: I20.9

## 2012-04-24 HISTORY — DX: Acute myocardial infarction, unspecified: I21.9

## 2012-04-24 HISTORY — DX: Shortness of breath: R06.02

## 2012-04-24 NOTE — Progress Notes (Signed)
REQUESTED STRESS TEST , LAST OFFICE NOTE FROM [facility] DR Katrinka Blazing, [phone].

## 2012-04-24 NOTE — Pre-Procedure Instructions (Signed)
20 Sonia Robertson  04/24/2012   Your procedure is scheduled on:  05/06/12  Report to [facility] at 5:30 AM.  Call this number if you have problems the morning of surgery: [phone]   Remember: Discontinue Aspirin, Coumadin, Plavix, Effient and herbal medications.   Do not eat food:After Midnight.  May have clear liquids: up to 4 Hours before arrival (1:30 AM).  Clear liquids include soda, tea, black coffee, apple or grape juice, broth.  Take these medicines the morning of surgery with A SIP OF WATER: Norvasc/Amlodipine, Atenolol/Tenormin*, Imdur/isosorbide   Do not wear jewelry, make-up or nail polish.  Do not wear lotions, powders, or perfumes. You may wear deodorant.  Do not shave 48 hours prior to surgery. Men may shave face and neck.  Do not bring valuables to the hospital.  Contacts, dentures or bridgework may not be worn into surgery.  Leave suitcase in the car. After surgery it may be brought to your room.  For patients admitted to the hospital, checkout time is 11:00 AM the day of discharge.   Patients discharged the day of surgery will not be allowed to drive home.    Special Instructions: CHG Shower Use Special Wash: 1/2 bottle night before surgery and 1/2 bottle morning of surgery.   Please read over the following fact sheets that you were given: Pain Booklet, Coughing and Deep Breathing, Blood Transfusion Information, MRSA Information and Surgical Site Infection Prevention

## 2012-04-24 NOTE — Progress Notes (Signed)
CONFIRMED WITH PATIENT LAB APPOINTMENT 05/02/12 1540 PM.

## 2012-05-01 NOTE — Consult Note (Addendum)
Anesthesia Chart Review:  Patient is a 56 year old female scheduled for L3-5 laminectomies, resection of left L3-4 synovial cyst, L4-5 PLIF on 05/06/12.  History includes smoking, HTN, hypothyroidism, anterior MI '88 s/p PTCA DIAG1, SOB.  PCP is Dr. Pete Glatter.  Her Cardiologist is Dr. Verdis Prime.  He last saw her on 02/27/12.  EKG then showed SR with PVCs and PACs, nonspecific ST abnormality.  One year follow-up was recommended.  Her last stress test was on 02/07/07 and suggested anterior wall ischemia, EF 66%.  She subsequently had a cardiac cath on 02/11/07 that showed severe diffuse vasoconstriction treated with Nitroglycerin, then normal left main, 50-70% mid LAD, 50% distal LAD, DIAG1 50-60%, minimal luminal irregularities CX and RCA, normal LV function.  He recommended adding  long-acting nitrate and calcium channel blocker therapy, continued statin and beta-blocker therapy, and consideration of empiric LAD PCI if with continued chest discomfort.  She remains on amlodipine, atenolol, atorvastatin, ASA, and isosorbide mononitrate.  I spoke with her via telephone prior to her PAT appointment.  She feels well from a CV standpoint.  She did report that she has to rarely (~ 2-3/year) use a Nitroglycerin tablet for exertional vague chest discomfort, last in March 2013.  She denies any significant SOB or edema.   CXR on 05/02/12 showed biapical scarring. No active disease.   Labs noted..    By Dr. Corky Sing notes, Mrs. Abramovich appeared stable from a CV standpoint.  Due to her 2008 cardiac cath results and Nitro use within the past three months, I called and spoke with Dr. Katrinka Blazing.  He will be in the office on 05/03/12 and will further review her chart and make a final recommendation about her upcoming surgery at that time.  I will be out of the office until 05/06/12, so I will ask our Charge Nurse to follow-up on his recommendations.  I also updated Graciella Belton at Dr. Antony Contras office.    Shonna Chock,  PA-C

## 2012-05-02 ENCOUNTER — Encounter (HOSPITAL_COMMUNITY)
Admission: RE | Admit: 2012-05-02 | Discharge: 2012-05-02 | Disposition: A | Discharge status: Home / Self Care | Payer: BC Managed Care – PPO | Source: Ambulatory Visit | Attending: Neurosurgery | Admitting: Neurosurgery

## 2012-05-02 ENCOUNTER — Ambulatory Visit (HOSPITAL_COMMUNITY)
Admission: RE | Admit: 2012-05-02 | Discharge: 2012-05-02 | Disposition: A | Discharge status: Home / Self Care | Payer: BC Managed Care – PPO | Source: Ambulatory Visit | Attending: Anesthesiology | Admitting: Anesthesiology

## 2012-05-02 DIAGNOSIS — Z01812 Encounter for preprocedural laboratory examination: Secondary | ICD-10-CM | POA: Insufficient documentation

## 2012-05-02 DIAGNOSIS — Z01818 Encounter for other preprocedural examination: Secondary | ICD-10-CM | POA: Insufficient documentation

## 2012-05-02 DIAGNOSIS — I10 Essential (primary) hypertension: Secondary | ICD-10-CM | POA: Insufficient documentation

## 2012-05-02 LAB — SURGICAL PCR SCREEN
MRSA, PCR: NEGATIVE
Staphylococcus aureus: POSITIVE — AB

## 2012-05-02 LAB — CBC
HCT: 43.7 % (ref 36.0–46.0)
Hemoglobin: 15.1 g/dL — ABNORMAL HIGH (ref 12.0–15.0)
MCH: 32.1 pg (ref 26.0–34.0)
MCHC: 34.6 g/dL (ref 30.0–36.0)
MCV: 92.8 fL (ref 78.0–100.0)
Platelets: 143 10*3/uL — ABNORMAL LOW (ref 150–400)
RBC: 4.71 MIL/uL (ref 3.87–5.11)
RDW: 12.9 % (ref 11.5–15.5)
WBC: 6.9 10*3/uL (ref 4.0–10.5)

## 2012-05-02 LAB — BASIC METABOLIC PANEL
BUN: 16 mg/dL (ref 6–23)
CO2: 30 mEq/L (ref 19–32)
Calcium: 9.3 mg/dL (ref 8.4–10.5)
Chloride: 106 mEq/L (ref 96–112)
Creatinine, Ser: 0.71 mg/dL (ref 0.50–1.10)
GFR calc Af Amer: >90 mL/min (ref >90)
GFR calc non Af Amer: >90 mL/min (ref >90)
Glucose, Bld: 92 mg/dL (ref 70–99)
Potassium: 3.7 mEq/L (ref 3.5–5.1)
Sodium: 145 mEq/L (ref 135–145)

## 2012-05-02 LAB — TYPE AND SCREEN
ABO/RH(D): O NEG
Antibody Screen: NEGATIVE

## 2012-05-02 LAB — ABO/RH: ABO/RH(D): O NEG

## 2012-05-03 NOTE — Progress Notes (Signed)
I spoke with Darl Pikes with [facility] care and Dr. Newell Coral, and informed her that I had not heard anything from Dr. [facility] I informed her.that there was nothing in proficient or epic under letters, media or notes regarding recent request from Dr. Katrinka Blazing,  that she would need to follow up, and I will hand off the chart to a later RN in pre admit when it's time for me to leave today.Marland Kitchen

## 2012-05-05 MED ORDER — CEFAZOLIN SODIUM 1-5 GM-% IV SOLN
1.0000 g | INTRAVENOUS | Status: AC
  Administered 2012-05-06 (×2): 1 g via INTRAVENOUS
  Filled 2012-05-05: qty 50

## 2012-05-06 ENCOUNTER — Ambulatory Visit (HOSPITAL_COMMUNITY): Payer: BC Managed Care – PPO | Admitting: Vascular Surgery

## 2012-05-06 ENCOUNTER — Inpatient Hospital Stay (HOSPITAL_COMMUNITY)
Admission: RE | Admit: 2012-05-06 | Discharge: 2012-05-07 | DRG: 756 | Disposition: A | Discharge status: Home / Self Care | Payer: BC Managed Care – PPO | Source: Ambulatory Visit | Attending: Neurosurgery | Admitting: Neurosurgery

## 2012-05-06 ENCOUNTER — Encounter (HOSPITAL_COMMUNITY): Payer: Self-pay | Admitting: Vascular Surgery

## 2012-05-06 ENCOUNTER — Encounter (HOSPITAL_COMMUNITY)
Admission: RE | Disposition: A | Discharge status: Home / Self Care | Payer: Self-pay | Source: Ambulatory Visit | Attending: Neurosurgery

## 2012-05-06 ENCOUNTER — Encounter (HOSPITAL_COMMUNITY): Payer: Self-pay | Admitting: Anesthesiology

## 2012-05-06 ENCOUNTER — Ambulatory Visit (HOSPITAL_COMMUNITY): Payer: BC Managed Care – PPO

## 2012-05-06 DIAGNOSIS — R0602 Shortness of breath: Secondary | ICD-10-CM | POA: Diagnosis present

## 2012-05-06 DIAGNOSIS — M47817 Spondylosis without myelopathy or radiculopathy, lumbosacral region: Principal | ICD-10-CM | POA: Diagnosis present

## 2012-05-06 DIAGNOSIS — I2510 Atherosclerotic heart disease of native coronary artery without angina pectoris: Secondary | ICD-10-CM | POA: Diagnosis present

## 2012-05-06 DIAGNOSIS — F17200 Nicotine dependence, unspecified, uncomplicated: Secondary | ICD-10-CM | POA: Diagnosis present

## 2012-05-06 DIAGNOSIS — E039 Hypothyroidism, unspecified: Secondary | ICD-10-CM | POA: Diagnosis present

## 2012-05-06 DIAGNOSIS — I252 Old myocardial infarction: Secondary | ICD-10-CM

## 2012-05-06 DIAGNOSIS — M4310 Spondylolisthesis, site unspecified: Secondary | ICD-10-CM | POA: Diagnosis present

## 2012-05-06 DIAGNOSIS — I10 Essential (primary) hypertension: Secondary | ICD-10-CM | POA: Diagnosis present

## 2012-05-06 DIAGNOSIS — M7130 Other bursal cyst, unspecified site: Secondary | ICD-10-CM | POA: Diagnosis present

## 2012-05-06 SURGERY — POSTERIOR LUMBAR FUSION 2 LEVEL
Anesthesia: General | Site: Back | Wound class: Clean

## 2012-05-06 MED ORDER — BUPIVACAINE HCL (PF) 0.5 % IJ SOLN
INTRAMUSCULAR | Status: DC | PRN
  Administered 2012-05-06: 15 mL

## 2012-05-06 MED ORDER — EPHEDRINE SULFATE 50 MG/ML IJ SOLN
INTRAMUSCULAR | Status: DC | PRN
  Administered 2012-05-06: 5 mg via INTRAVENOUS
  Administered 2012-05-06: 10 mg via INTRAVENOUS
  Administered 2012-05-06: 5 mg via INTRAVENOUS

## 2012-05-06 MED ORDER — ISOSORBIDE MONONITRATE ER 60 MG PO TB24: 60.0000 mg | ORAL_TABLET | Freq: Every day | ORAL | Status: DC

## 2012-05-06 MED ORDER — PROPOFOL 10 MG/ML IV EMUL
INTRAVENOUS | Status: DC | PRN
  Administered 2012-05-06: 200 mg via INTRAVENOUS

## 2012-05-06 MED ORDER — AMLODIPINE BESYLATE 10 MG PO TABS
10.0000 mg | ORAL_TABLET | Freq: Every day | ORAL | Status: DC
  Filled 2012-05-06: qty 1

## 2012-05-06 MED ORDER — BISACODYL 10 MG RE SUPP: 10.0000 mg | Freq: Every day | RECTAL | Status: DC | PRN

## 2012-05-06 MED ORDER — MAGNESIUM HYDROXIDE 400 MG/5ML PO SUSP: 30.0000 mL | Freq: Every day | ORAL | Status: DC | PRN

## 2012-05-06 MED ORDER — SODIUM CHLORIDE 0.9 % IJ SOLN
3.0000 mL | INTRAMUSCULAR | Status: DC | PRN
  Administered 2012-05-06: 3 mL via INTRAVENOUS

## 2012-05-06 MED ORDER — HYDROXYZINE HCL 50 MG/ML IM SOLN: 50.0000 mg | INTRAMUSCULAR | Status: DC | PRN

## 2012-05-06 MED ORDER — ONDANSETRON HCL 4 MG/2ML IJ SOLN: 4.0000 mg | Freq: Once | INTRAMUSCULAR | Status: DC | PRN

## 2012-05-06 MED ORDER — ATORVASTATIN CALCIUM 20 MG PO TABS
20.0000 mg | ORAL_TABLET | Freq: Every day | ORAL | Status: DC
  Administered 2012-05-06: 20 mg via ORAL
  Filled 2012-05-06 (×2): qty 1

## 2012-05-06 MED ORDER — BACITRACIN 50000 UNITS IM SOLR
INTRAMUSCULAR | Status: AC
  Filled 2012-05-06: qty 1

## 2012-05-06 MED ORDER — ALUM & MAG HYDROXIDE-SIMETH 200-200-20 MG/5ML PO SUSP: 30.0000 mL | Freq: Four times a day (QID) | ORAL | Status: DC | PRN

## 2012-05-06 MED ORDER — ACETAMINOPHEN 10 MG/ML IV SOLN
1000.0000 mg | Freq: Four times a day (QID) | INTRAVENOUS | Status: DC
  Administered 2012-05-06 – 2012-05-07 (×3): 1000 mg via INTRAVENOUS
  Filled 2012-05-06 (×4): qty 100

## 2012-05-06 MED ORDER — HYDROMORPHONE HCL PF 1 MG/ML IJ SOLN: 2.0000 mg | INTRAMUSCULAR | Status: DC | PRN

## 2012-05-06 MED ORDER — KETOROLAC TROMETHAMINE 30 MG/ML IJ SOLN
INTRAMUSCULAR | Status: AC
  Filled 2012-05-06: qty 1

## 2012-05-06 MED ORDER — SODIUM CHLORIDE 0.9 % IR SOLN
Status: DC | PRN
  Administered 2012-05-06: 08:00:00

## 2012-05-06 MED ORDER — MIDAZOLAM HCL 5 MG/5ML IJ SOLN
INTRAMUSCULAR | Status: DC | PRN
  Administered 2012-05-06: 2 mg via INTRAVENOUS

## 2012-05-06 MED ORDER — LEVOTHYROXINE SODIUM 75 MCG PO TABS
75.0000 ug | ORAL_TABLET | Freq: Every day | ORAL | Status: DC
  Filled 2012-05-06 (×3): qty 1

## 2012-05-06 MED ORDER — ESTRADIOL 0.075 MG/24HR TD PTWK: 0.0750 mg | MEDICATED_PATCH | TRANSDERMAL | Status: DC

## 2012-05-06 MED ORDER — ONDANSETRON HCL 4 MG/2ML IJ SOLN
INTRAMUSCULAR | Status: DC | PRN
  Administered 2012-05-06: 4 mg via INTRAVENOUS

## 2012-05-06 MED ORDER — ROCURONIUM BROMIDE 100 MG/10ML IV SOLN
INTRAVENOUS | Status: DC | PRN
  Administered 2012-05-06 (×4): 10 mg via INTRAVENOUS
  Administered 2012-05-06: 40 mg via INTRAVENOUS
  Administered 2012-05-06: 10 mg via INTRAVENOUS

## 2012-05-06 MED ORDER — SODIUM CHLORIDE 0.9 % IJ SOLN: 3.0000 mL | Freq: Two times a day (BID) | INTRAMUSCULAR | Status: DC

## 2012-05-06 MED ORDER — SODIUM CHLORIDE 0.9 % IV SOLN
INTRAVENOUS | Status: DC | PRN
  Administered 2012-05-06: 12:00:00 via INTRAVENOUS

## 2012-05-06 MED ORDER — CYCLOBENZAPRINE HCL 10 MG PO TABS: 10.0000 mg | ORAL_TABLET | Freq: Three times a day (TID) | ORAL | Status: DC | PRN

## 2012-05-06 MED ORDER — KETOROLAC TROMETHAMINE 30 MG/ML IJ SOLN
30.0000 mg | Freq: Four times a day (QID) | INTRAMUSCULAR | Status: DC
  Administered 2012-05-06 – 2012-05-07 (×3): 30 mg via INTRAVENOUS
  Filled 2012-05-06 (×7): qty 1

## 2012-05-06 MED ORDER — SODIUM CHLORIDE 0.9 % IV SOLN
INTRAVENOUS | Status: AC
  Filled 2012-05-06: qty 500

## 2012-05-06 MED ORDER — PHENOL 1.4 % MT LIQD: 1.0000 | OROMUCOSAL | Status: DC | PRN

## 2012-05-06 MED ORDER — PHENYLEPHRINE HCL 10 MG/ML IJ SOLN
10.0000 mg | INTRAVENOUS | Status: DC | PRN
  Administered 2012-05-06: 10 ug/min via INTRAVENOUS

## 2012-05-06 MED ORDER — MENTHOL 3 MG MT LOZG: 1.0000 | LOZENGE | OROMUCOSAL | Status: DC | PRN

## 2012-05-06 MED ORDER — NEOSTIGMINE METHYLSULFATE 1 MG/ML IJ SOLN
INTRAMUSCULAR | Status: DC | PRN
  Administered 2012-05-06: 3 mg via INTRAVENOUS

## 2012-05-06 MED ORDER — THROMBIN 20000 UNITS EX SOLR
CUTANEOUS | Status: DC | PRN
  Administered 2012-05-06: 20000 [IU] via TOPICAL

## 2012-05-06 MED ORDER — HYDROCODONE-ACETAMINOPHEN 5-325 MG PO TABS: 1.0000 | ORAL_TABLET | ORAL | Status: DC | PRN

## 2012-05-06 MED ORDER — LIDOCAINE-EPINEPHRINE 1 %-1:100000 IJ SOLN
INTRAMUSCULAR | Status: DC | PRN
  Administered 2012-05-06: 15 mL

## 2012-05-06 MED ORDER — DEXAMETHASONE SODIUM PHOSPHATE 10 MG/ML IJ SOLN
INTRAMUSCULAR | Status: DC | PRN
  Administered 2012-05-06: 10 mg via INTRAVENOUS

## 2012-05-06 MED ORDER — ISOSORBIDE MONONITRATE ER 60 MG PO TB24
60.0000 mg | ORAL_TABLET | Freq: Every day | ORAL | Status: DC
  Administered 2012-05-06: 60 mg via ORAL
  Filled 2012-05-06 (×2): qty 1

## 2012-05-06 MED ORDER — HYDROXYZINE HCL 25 MG PO TABS: 50.0000 mg | ORAL_TABLET | ORAL | Status: DC | PRN

## 2012-05-06 MED ORDER — HYDROMORPHONE HCL PF 1 MG/ML IJ SOLN: 0.2500 mg | INTRAMUSCULAR | Status: DC | PRN

## 2012-05-06 MED ORDER — GLYCOPYRROLATE 0.2 MG/ML IJ SOLN
INTRAMUSCULAR | Status: DC | PRN
Start: 2012-05-06 — End: 2012-05-06
  Administered 2012-05-06: .4 mg via INTRAVENOUS
  Administered 2012-05-06 (×2): 0.2 mg via INTRAVENOUS

## 2012-05-06 MED ORDER — 0.9 % SODIUM CHLORIDE (POUR BTL) OPTIME
TOPICAL | Status: DC | PRN
  Administered 2012-05-06 (×2): 1000 mL

## 2012-05-06 MED ORDER — ATENOLOL 25 MG PO TABS
25.0000 mg | ORAL_TABLET | Freq: Every day | ORAL | Status: DC
  Filled 2012-05-06: qty 1

## 2012-05-06 MED ORDER — HEMOSTATIC AGENTS (NO CHARGE) OPTIME
TOPICAL | Status: DC | PRN
  Administered 2012-05-06: 1 via TOPICAL

## 2012-05-06 MED ORDER — OXYCODONE-ACETAMINOPHEN 5-325 MG PO TABS: 1.0000 | ORAL_TABLET | ORAL | Status: DC | PRN

## 2012-05-06 MED ORDER — MUPIROCIN 2 % EX OINT
TOPICAL_OINTMENT | Freq: Two times a day (BID) | CUTANEOUS | Status: DC
  Administered 2012-05-06: 22:00:00 via NASAL
  Filled 2012-05-06: qty 22

## 2012-05-06 MED ORDER — KETOROLAC TROMETHAMINE 30 MG/ML IJ SOLN
30.0000 mg | Freq: Once | INTRAMUSCULAR | Status: AC
  Administered 2012-05-06: 30 mg via INTRAVENOUS

## 2012-05-06 MED ORDER — LACTATED RINGERS IV SOLN
INTRAVENOUS | Status: DC | PRN
  Administered 2012-05-06 (×3): via INTRAVENOUS

## 2012-05-06 MED ORDER — CEFAZOLIN SODIUM 1-5 GM-% IV SOLN
INTRAVENOUS | Status: AC
  Filled 2012-05-06: qty 50

## 2012-05-06 MED ORDER — ACETAMINOPHEN 10 MG/ML IV SOLN
INTRAVENOUS | Status: DC | PRN
  Administered 2012-05-06: 1000 mg via INTRAVENOUS

## 2012-05-06 MED ORDER — ACETAMINOPHEN 650 MG RE SUPP: 650.0000 mg | RECTAL | Status: DC | PRN

## 2012-05-06 MED ORDER — LIDOCAINE HCL (CARDIAC) 20 MG/ML IV SOLN
INTRAVENOUS | Status: DC | PRN
  Administered 2012-05-06: 100 mg via INTRAVENOUS

## 2012-05-06 MED ORDER — ZOLPIDEM TARTRATE 5 MG PO TABS: 10.0000 mg | ORAL_TABLET | Freq: Every evening | ORAL | Status: DC | PRN

## 2012-05-06 MED ORDER — KCL IN DEXTROSE-NACL 30-5-0.45 MEQ/L-%-% IV SOLN
INTRAVENOUS | Status: DC
  Administered 2012-05-06: 18:00:00 via INTRAVENOUS
  Filled 2012-05-06 (×6): qty 1000

## 2012-05-06 MED ORDER — ACETAMINOPHEN 10 MG/ML IV SOLN
INTRAVENOUS | Status: AC
  Filled 2012-05-06: qty 100

## 2012-05-06 MED ORDER — DARIFENACIN HYDROBROMIDE ER 15 MG PO TB24
15.0000 mg | ORAL_TABLET | Freq: Every day | ORAL | Status: DC
  Filled 2012-05-06 (×2): qty 1

## 2012-05-06 MED ORDER — ACETAMINOPHEN 325 MG PO TABS: 650.0000 mg | ORAL_TABLET | ORAL | Status: DC | PRN

## 2012-05-06 MED ORDER — FENTANYL CITRATE 0.05 MG/ML IJ SOLN
INTRAMUSCULAR | Status: DC | PRN
  Administered 2012-05-06 (×2): 50 ug via INTRAVENOUS
  Administered 2012-05-06: 150 ug via INTRAVENOUS
  Administered 2012-05-06 (×2): 25 ug via INTRAVENOUS
  Administered 2012-05-06: 50 ug via INTRAVENOUS

## 2012-05-06 SURGICAL SUPPLY — 87 items
BAG DECANTER FOR FLEXI CONT (MISCELLANEOUS) ×2 IMPLANT
BENZOIN TINCTURE PRP APPL 2/3 (GAUZE/BANDAGES/DRESSINGS) IMPLANT
BLADE SURG ROTATE 9660 (MISCELLANEOUS) IMPLANT
BRUSH SCRUB EZ PLAIN DRY (MISCELLANEOUS) ×2 IMPLANT
BUR ACORN 6.0 ACORN (BURR) IMPLANT
BUR ACRON 5.0MM COATED (BURR) ×2 IMPLANT
BUR MATCHSTICK NEURO 3.0 LAGG (BURR) ×2 IMPLANT
CANISTER SUCTION 2500CC (MISCELLANEOUS) ×2 IMPLANT
CAP LCK SPNE (Orthopedic Implant) ×6 IMPLANT
CAP LOCK SPINE RADIUS (Orthopedic Implant) ×6 IMPLANT
CAP LOCKING (Orthopedic Implant) ×6 IMPLANT
CLOTH BEACON ORANGE TIMEOUT ST (SAFETY) ×2 IMPLANT
CONT SPEC 4OZ CLIKSEAL STRL BL (MISCELLANEOUS) ×2 IMPLANT
CONT SPEC STER OR (MISCELLANEOUS) ×2 IMPLANT
COVER BACK TABLE 24X17X13 BIG (DRAPES) IMPLANT
COVER TABLE BACK 60X90 (DRAPES) ×2 IMPLANT
DERMABOND ADVANCED (GAUZE/BANDAGES/DRESSINGS) ×2
DERMABOND ADVANCED .7 DNX12 (GAUZE/BANDAGES/DRESSINGS) ×2 IMPLANT
DRAPE C-ARM 42X72 X-RAY (DRAPES) ×4 IMPLANT
DRAPE LAPAROTOMY 100X72X124 (DRAPES) ×2 IMPLANT
DRAPE POUCH INSTRU U-SHP 10X18 (DRAPES) ×2 IMPLANT
DRAPE PROXIMA HALF (DRAPES) ×2 IMPLANT
DRAPE SURG 17X23 STRL (DRAPES) ×2 IMPLANT
DRESSING TELFA 8X3 (GAUZE/BANDAGES/DRESSINGS) IMPLANT
DRSG EMULSION OIL 3X3 NADH (GAUZE/BANDAGES/DRESSINGS) IMPLANT
DURAPREP 26ML APPLICATOR (WOUND CARE) IMPLANT
ELECT REM PT RETURN 9FT ADLT (ELECTROSURGICAL) ×2
ELECTRODE REM PT RTRN 9FT ADLT (ELECTROSURGICAL) ×1 IMPLANT
GAUZE SPONGE 4X4 16PLY XRAY LF (GAUZE/BANDAGES/DRESSINGS) ×2 IMPLANT
GLOVE BIO SURGEON STRL SZ7.5 (GLOVE) ×2 IMPLANT
GLOVE BIOGEL PI IND STRL 7.0 (GLOVE) ×4 IMPLANT
GLOVE BIOGEL PI IND STRL 8 (GLOVE) ×2 IMPLANT
GLOVE BIOGEL PI INDICATOR 7.0 (GLOVE) ×4
GLOVE BIOGEL PI INDICATOR 8 (GLOVE) ×2
GLOVE ECLIPSE 7.5 STRL STRAW (GLOVE) ×4 IMPLANT
GLOVE EXAM NITRILE LRG STRL (GLOVE) IMPLANT
GLOVE EXAM NITRILE MD LF STRL (GLOVE) ×4 IMPLANT
GLOVE EXAM NITRILE XL STR (GLOVE) IMPLANT
GLOVE EXAM NITRILE XS STR PU (GLOVE) IMPLANT
GLOVE INDICATOR 7.0 STRL GRN (GLOVE) ×4 IMPLANT
GLOVE INDICATOR 7.5 STRL GRN (GLOVE) ×2 IMPLANT
GOWN BRE IMP SLV AUR LG STRL (GOWN DISPOSABLE) IMPLANT
GOWN BRE IMP SLV AUR XL STRL (GOWN DISPOSABLE) ×4 IMPLANT
GOWN STRL REIN 2XL LVL4 (GOWN DISPOSABLE) ×2 IMPLANT
KIT BASIN OR (CUSTOM PROCEDURE TRAY) ×2 IMPLANT
KIT INFUSE SMALL (Orthopedic Implant) ×2 IMPLANT
KIT POSITION SURG JACKSON T1 (MISCELLANEOUS) ×2 IMPLANT
KIT ROOM TURNOVER OR (KITS) ×2 IMPLANT
MILL MEDIUM DISP (BLADE) ×2 IMPLANT
NEEDLE 18GX1X1/2 (RX/OR ONLY) (NEEDLE) ×2 IMPLANT
NEEDLE BONE MARROW 8GAX6 (NEEDLE) ×2 IMPLANT
NEEDLE HYPO 25X1 1.5 SAFETY (NEEDLE) ×2 IMPLANT
NEEDLE SPNL 18GX3.5 QUINCKE PK (NEEDLE) IMPLANT
NEEDLE SPNL 22GX3.5 QUINCKE BK (NEEDLE) ×2 IMPLANT
NS IRRIG 1000ML POUR BTL (IV SOLUTION) ×4 IMPLANT
PACK LAMINECTOMY NEURO (CUSTOM PROCEDURE TRAY) ×2 IMPLANT
PAD ARMBOARD 7.5X6 YLW CONV (MISCELLANEOUS) ×6 IMPLANT
PATTIES SURGICAL .5 X.5 (GAUZE/BANDAGES/DRESSINGS) IMPLANT
PATTIES SURGICAL .5 X1 (DISPOSABLE) IMPLANT
PATTIES SURGICAL 1X1 (DISPOSABLE) IMPLANT
PEEK PLIF AVS 10X25X4 (Peek) ×4 IMPLANT
PEEK PLIF AVS 12X25X4 (Peek) ×4 IMPLANT
ROD 5.5X60MM GREEN (Rod) ×4 IMPLANT
SCREW 5.75X45MM (Screw) ×4 IMPLANT
SCREW 5.75X50MM (Screw) ×8 IMPLANT
SPONGE GAUZE 4X4 12PLY (GAUZE/BANDAGES/DRESSINGS) ×2 IMPLANT
SPONGE LAP 4X18 X RAY DECT (DISPOSABLE) ×2 IMPLANT
SPONGE NEURO XRAY DETECT 1X3 (DISPOSABLE) ×2 IMPLANT
SPONGE SURGIFOAM ABS GEL 100 (HEMOSTASIS) ×2 IMPLANT
STAPLER SKIN PROX WIDE 3.9 (STAPLE) IMPLANT
STRIP BIOACTIVE VITOSS 25X100X (Neuro Prosthesis/Implant) ×4 IMPLANT
STRIP CLOSURE SKIN 1/2X4 (GAUZE/BANDAGES/DRESSINGS) ×2 IMPLANT
SUT PROLENE 6 0 BV (SUTURE) IMPLANT
SUT VIC AB 1 CT1 18XBRD ANBCTR (SUTURE) ×3 IMPLANT
SUT VIC AB 1 CT1 8-18 (SUTURE) ×3
SUT VIC AB 2-0 CP2 18 (SUTURE) ×4 IMPLANT
SYR 20CC LL (SYRINGE) ×2 IMPLANT
SYR 3ML LL SCALE MARK (SYRINGE) IMPLANT
SYR 5ML LL (SYRINGE) IMPLANT
SYR CONTROL 10ML LL (SYRINGE) ×2 IMPLANT
SYR INSULIN 1ML 31GX6 SAFETY (SYRINGE) IMPLANT
TAPE CLOTH SURG 4X10 WHT LF (GAUZE/BANDAGES/DRESSINGS) ×2 IMPLANT
TOWEL OR 17X24 6PK STRL BLUE (TOWEL DISPOSABLE) ×2 IMPLANT
TOWEL OR 17X26 10 PK STRL BLUE (TOWEL DISPOSABLE) ×2 IMPLANT
TRAP SPECIMEN MUCOUS 40CC (MISCELLANEOUS) ×2 IMPLANT
TRAY FOLEY CATH 14FRSI W/METER (CATHETERS) ×2 IMPLANT
WATER STERILE IRR 1000ML POUR (IV SOLUTION) ×2 IMPLANT

## 2012-05-06 NOTE — Preoperative (Signed)
Beta Blockers   Reason not to administer Beta Blockers:Not Applicable 

## 2012-05-06 NOTE — Transfer of Care (Signed)
Immediate Anesthesia Transfer of Care Note  Patient: Sonia Robertson  Procedure(s) Performed: Procedure(s) (LRB): POSTERIOR LUMBAR FUSION 2 LEVEL (N/A)  Patient Location: PACU  Anesthesia Type: General  Level of Consciousness: awake and alert   Airway & Oxygen Therapy: Patient Spontanous Breathing and Patient connected to face mask oxygen  Post-op Assessment: Report given to PACU RN  Post vital signs: Reviewed and stable  Complications: No apparent anesthesia complications

## 2012-05-06 NOTE — H&P (Signed)
Subjective: Patient is a 56 y.o. female who is admitted for treatment of multilevel lumbar degenerative dynamic spondylolisthesis, large L3-4 synovial cyst, and multilevel multifactorial severe lumbar stenosis. Patient's symptoms began over a year ago with gradually increasing pain to the buttocks, posterior thighs, and legs bilaterally, left worse than right. Patient's been treated with end-stage, as well as epidural steroid injections, neither of which have given her much in the way of relief. X-rays show a dynamic grade 1 spinal listhesis of L3 on 4, and dynamic grade 2 spondylosis of L4-5; MRI scan reconfirms the spondylolisthesis with advanced facet arthropathy at both the L3-4 and L4-5 levels, additionally at L3-4 there is a large left sided synovial cyst. Patient is admitted now for L3-L5 decompressive lumbar laminectomy, resection of synovial cyst, bilateral L3-4 and L4-5 posterior lumbar interbody arthrodesis with interbody implants and bone graft and a bilateral L3-L5 posterior lateral arthrodesis with postreduction patient and bone graft.   Patient Active Problem List  Diagnoses Date Noted  . Fibroid    Past Medical History  Diagnosis Date  . Fibroid   . Hypertension   . Thyroid disease   . Myocardial infarction     1988  . Angina     NONE SINCE MARCH   SEES DR Mendel Ryder    . Shortness of breath     WITH EXERTION  . Hypothyroidism     Past Surgical History  Procedure Date  . Abdominal hysterectomy 2004    TAH BSO  . Oophorectomy     BSO  . Knee surgery     Arthroscopic  . Cardiac catheterization     Prescriptions prior to admission  Medication Sig Dispense Refill  . amLODipine (NORVASC) 10 MG tablet Take 10 mg by mouth daily.        Marland Kitchen aspirin 325 MG tablet Take 325 mg by mouth daily.        Marland Kitchen atenolol (TENORMIN) 25 MG tablet Take 25 mg by mouth daily.        Marland Kitchen atorvastatin (LIPITOR) 40 MG tablet Take 20 mg by mouth daily.       . diclofenac (VOLTAREN) 75 MG EC tablet  Take 75 mg by mouth daily as needed. Swelling.      Marland Kitchen estradiol (CLIMARA - DOSED IN MG/24 HR) 0.075 mg/24hr Place 1 patch (0.075 mg total) onto the skin once a week.  12 patch  3  . ibuprofen (ADVIL,MOTRIN) 200 MG tablet Take 800 mg by mouth every 8 (eight) hours as needed. Pain/ swelling.  Only if she has not taken Diclofenac.      . isosorbide mononitrate (IMDUR) 60 MG 24 hr tablet Take 60 mg by mouth daily.        Marland Kitchen levothyroxine (SYNTHROID, LEVOTHROID) 75 MCG tablet Take 75 mcg by mouth daily.        . solifenacin (VESICARE) 10 MG tablet Take 10 mg by mouth daily.         Allergies  Allergen Reactions  . Morphine And Related Nausea And Vomiting    History  Substance Use Topics  . Smoking status: Current Everyday Smoker -- 1.0 packs/day    Types: Cigarettes  . Smokeless tobacco: Not on file  . Alcohol Use: No    Family History  Problem Relation Age of Onset  . Adopted: Yes     Review of Systems A comprehensive review of systems was negative.  Objective: Vital signs in last 24 hours: Temp:  [97.6 F (36.4 C)] 97.6  F (36.4 C) (06/03 0630) Pulse Rate:  [54] 54  (06/03 0630) Resp:  [20] 20  (06/03 0630) BP: (121-141)/(80-93) 121/80 mmHg (06/03 0645) SpO2:  [95 %] 95 % (06/03 0630)  EXAM: Patient is a well-developed well-nourished white female in no acute distress. Lungs are clear to auscultation , the patient has symmetrical respiratory excursion. Heart has a regular rate and rhythm normal S1 and S2 no murmur.   Abdomen is soft nontender nondistended bowel sounds are present. Extremity examination shows no clubbing cyanosis or edema. Musculoskeletal examination shows no tenderness to palpation over the lumbar spinous processes or paralumbar musculature. She is able to flex to 90, extension is limited at less than 5. Straight leg raising is negative bilaterally. Motor examination shows 5 over 5 strength in the lower extremities including the iliopsoas quadriceps dorsiflexor  extensor hallicus  longus and plantar flexor bilaterally. Sensation is intact to pinprick in the distal lower extremities. Reflexes are symmetrical bilaterally. No pathologic reflexes are present. Patient has a normal gait and stance.   Data Review:CBC    Component Value Date/Time   WBC 6.9 05/02/2012 1618   RBC 4.71 05/02/2012 1618   HGB 15.1* 05/02/2012 1618   HCT 43.7 05/02/2012 1618   PLT 143* 05/02/2012 1618   MCV 92.8 05/02/2012 1618   MCH 32.1 05/02/2012 1618   MCHC 34.6 05/02/2012 1618   RDW 12.9 05/02/2012 1618                          BMET    Component Value Date/Time   NA 145 05/02/2012 1618   K 3.7 05/02/2012 1618   CL 106 05/02/2012 1618   CO2 30 05/02/2012 1618   GLUCOSE 92 05/02/2012 1618   BUN 16 05/02/2012 1618   CREATININE 0.71 05/02/2012 1618   CALCIUM 9.3 05/02/2012 1618   GFRNONAA >90 05/02/2012 1618   GFRAA >90 05/02/2012 1618     Assessment/Plan: Patient with advanced degenerative changes at the L3-4 and L4-5 levels as described above. To be admitted for decompression and stabilization at the L3-4 and L4-5 levels as described above. I've discussed with the patient the nature of his condition, the nature the surgical procedure, the typical length of surgery, hospital stay, and overall recuperation, the limitations postoperatively, and risks of surgery. I discussed risks including risks of infection, bleeding, possibly need for transfusion, the risk of nerve root dysfunction with pain, weakness, numbness, or paresthesias, the risk of dural tear and CSF leakage and possible need for further surgery, the risk of failure of the arthrodesis and possibly for further surgery, the risk of anesthetic complications including myocardial infarction, stroke, pneumonia, and death. We discussed the need for postoperative immobilization in a lumbar brace. Understanding all this the patient does wish to proceed with surgery and is admitted for such.     Hewitt Shorts, MD 05/06/2012 7:19  AM

## 2012-05-06 NOTE — Anesthesia Postprocedure Evaluation (Signed)
  Anesthesia Post-op Note  Patient: Sonia Robertson  Procedure(s) Performed: Procedure(s) (LRB): POSTERIOR LUMBAR FUSION 2 LEVEL (N/A)  Patient Location: PACU  Anesthesia Type: General  Level of Consciousness: awake, oriented, sedated and patient cooperative  Airway and Oxygen Therapy: Patient Spontanous Breathing and Patient connected to nasal cannula oxygen  Post-op Pain: mild  Post-op Assessment: Post-op Vital signs reviewed, Patient's Cardiovascular Status Stable, Respiratory Function Stable, Patent Airway, No signs of Nausea or vomiting and Pain level controlled  Post-op Vital Signs: stable  Complications: No apparent anesthesia complications

## 2012-05-06 NOTE — Anesthesia Preprocedure Evaluation (Addendum)
Anesthesia Evaluation  Patient identified by MRN, date of birth, ID band Patient awake    Reviewed: Allergy & Precautions, H&P , NPO status , Patient's Chart, lab work & pertinent test results, reviewed documented beta blocker date and time   Airway Mallampati: II TM Distance: >3 FB Neck ROM: Full    Dental  (+) Teeth Intact   Pulmonary shortness of breath,    Pulmonary exam normal       Cardiovascular hypertension, Pt. on home beta blockers and Pt. on medications + angina with exertion + CAD and + Past MI Rhythm:regular Rate:Normal     Neuro/Psych    GI/Hepatic   Endo/Other  Hypothyroidism   Renal/GU      Musculoskeletal   Abdominal Normal abdominal exam  (+)   Peds  Hematology   Anesthesia Other Findings   Reproductive/Obstetrics                         Anesthesia Physical Anesthesia Plan  ASA: III  Anesthesia Plan: General   Post-op Pain Management:    Induction: Intravenous  Airway Management Planned: Oral ETT  Additional Equipment:   Intra-op Plan:   Post-operative Plan: Extubation in OR  Informed Consent: I have reviewed the patients History and Physical, chart, labs and discussed the procedure including the risks, benefits and alternatives for the proposed anesthesia with the patient or authorized representative who has indicated his/her understanding and acceptance.   Dental advisory given  Plan Discussed with: CRNA, Surgeon and Anesthesiologist  Anesthesia Plan Comments:         Anesthesia Quick Evaluation

## 2012-05-06 NOTE — Progress Notes (Signed)
Filed Vitals:   05/06/12 1315 05/06/12 1330 05/06/12 1335 05/06/12 1400  BP: 120/62 137/57  131/79  Pulse: 52 54 53 77  Temp:   97 F (36.1 C) 97.8 F (36.6 C)  TempSrc:      Resp: 13 13 13 16   SpO2: 96% 95% 95% 93%    Patient resting in bed comfortably. Mild incisional discomfort. Good relief of radicular pain. Dressing clean and dry. Foley to straight drainage, to be removed this afternoon by nursing staff. Moving all 4 extremities well.  Plan: Continues thru postoperative recovery.  Hewitt Shorts, MD 05/06/2012, 3:38 PM

## 2012-05-06 NOTE — Op Note (Signed)
05/06/2012  12:22 PM  PATIENT:  Sonia Robertson  56 y.o. female  PRE-OPERATIVE DIAGNOSIS: Grade 1 L3-4 dynamic degenerative spondylolisthesis, grade 2 L4-5 dynamic degenerative spondylolisthesis, left L3-4 synovial cyst, lumbar stenosis, lumbar spondylosis  POST-OPERATIVE DIAGNOSIS:  Grade 1 L3-4 dynamic degenerative spondylolisthesis, grade 2 L4-5 dynamic degenerative spondylolisthesis, left L3-4 synovial cyst, lumbar stenosis, lumbar spondylosis  PROCEDURE:  Procedure(s): POSTERIOR LUMBAR FUSION 2 LEVEL: L3-L5 decompressive lumbar laminectomy, facetectomy, foraminotomy, with decompression of the exiting L3, L4, and L5 nerve roots bilaterally, with decompression beyond that required for interbody arthrodesis, with resection of left L3-4 synovial cyst, with microdissection and microsurgical technique, bilateral L3-4 and L4-5 posterior lumbar interbody arthrodesis with AVS peek interbody implants and Vitoss BA with bone marrow aspirate and infuse, and bilateral L3-L5 posterolateral arthrodesis with radius posterior instrumentation, Vitoss BA with bone marrow aspirate, infuse, and locally harvested morcellized autograft  SURGEON:  Surgeon(s): Hewitt Shorts, MD Temple Pacini, MD  ASSISTANTS: Julio Sicks, M.D.  ANESTHESIA:   general  EBL:  Total I/O In: 2640 [I.V.:2450; Blood:190] Out: 1400 [Urine:1000; Blood:400]  BLOOD ADMINISTERED:190 CC CELLSAVER  COUNT: Correct per nursing staff  DICTATION: Patient is brought to the operating room placed under general endotracheal anesthesia. The patient was turned to prone position the lumbar region was prepped with Betadine soap and solution and draped in a sterile fashion. The midline was infiltrated with local anesthesia with epinephrine. A midline incision was made carried down thru the subcutaneous tissue, bipolar cautery and electrocautery were used to maintain hemostasis, dissection was carried down to the lumbar fascia. The fascia was incised  bilaterally and the paraspinal muscles were dissected with a spinous process and lamina in a subperiosteal fashion. Another x-ray was taken for localization and the L3-4 and L4-5 levels were localized. Dissection was then carried out laterally over the facet complexes, and the transverse processes of  L3, L4, and L5 were exposed and decorticated. Bilateral L3, L4, and L5 laminectomy, facetectomy, and foraminotomy was performed using double-action rongeurs,  the high-speed drill, and Kerrison punches. Dissection was carried out laterally including facetectomy and foraminotomies with decompression of the L3, L4, and L5 nerve roots. the large left L3-4 synovial cyst was identified, carefully dissected from the dura, with which it had extensive adhesions, he was removed in total.  Once the decompression of the thecal sac and exiting nerve roots was completed we proceeded with the posterior lumbar interbody arthrodesis. The annulus was incised bilaterally at each level and the disc space entered.  A thorough discectomy was performed using pituitary rongeurs and curettes. Once the discectomy was completed we began to prepare the endplate surfaces removing the cartilaginous endplates surface. We then measured the height of the intervertebral disc space. We selected  12 x 25 x 4 at L3-4 and 10 x 25 x 4 at L4-5 AVS peek interbody implants.  The C-arm fluoroscope was then draped and brought in the field and we identified the pedicle entry points bilaterally at the each of the levels. Each of the 6 pedicles was probed, we aspirated bone marrow aspirate from the vertebral bodies this was injected over a 2 10 cc strips of Vitoss BA . Then each of the pedicles was examined with the ball probe good bony surfaces were found and no bony cuts were found. Each of the pedicles was then tapped with a 5.25 mm tap, again examined with the ball probe good threading was found and no bony cuts were found. We then placed 5.75 by  by 50  millimeter screws bilaterally at each of the L3 and L4  levels.at L5 we placed 5.75 x 45 mm screws bilaterally.   We then packed the AVS peek interbody implants with Vitoss BA  with bone marrow aspirate and infuse , and then placed the first implant and on the right sideat each level, carefully retracting the thecal sac and nerve root medially. We then went back to the left side and packed the midline with additional Vitoss BA  with bone marrow aspirate and infuse, and then placed a second implant on the left side again retracting the thecal sac and nerve root medially. Additional Vitoss BA with bone marrow aspirate was packed lateral to the implants at each level bilaterally.  We then packed the lateral gutter over the transverse processes and intertransverse space with Vitoss BA  with bone marrow aspirate, infuse and locally harvested morcellized autograft. We then selected  60 mm pre-lordosed rods, they were placed within the screw heads and secured with locking caps once all 6 locking caps were placed final tightening was performed against a counter torque. a medium variable cross connector was placed at the L3-4 level. He was secured to the rods and then the center tightening was performed.   The wound had been irrigated multiple times during the procedure with saline solution and bacitracin solution, good hemostasis was established with a combination of bipolar cautery and Gelfoam with thrombin. Once good hemostasis was confirmed we proceeded with closure paraspinal muscles deep fascia and Scarpa's fascia were closed with interrupted undyed 1 Vicryl sutures the subcutaneous and subcuticular closed with interrupted inverted 2-0 undyed Vicryl sutures the skin edges were approximated with  Dermabond. A dressing of sterile gauze and Hypafix was applied.  Following surgery the patient was turned back to the supine position to be reversed and the anesthetic extubated and transferred to the recovery room for  further care.   PLAN OF CARE: Admit for overnight observation  PATIENT DISPOSITION:  PACU - hemodynamically stable.   Delay start of Pharmacological VTE agent (>24hrs) due to surgical blood loss or risk of bleeding:  yes

## 2012-05-07 MED ORDER — HYDROCODONE-ACETAMINOPHEN 5-325 MG PO TABS: 1.0000 | ORAL_TABLET | ORAL | Status: AC | PRN

## 2012-05-07 NOTE — Discharge Summary (Signed)
Physician Discharge Summary  Patient ID: Sonia Robertson MRN: [id number] DOB/AGE: Dec 28, 1955 55 y.o.  Admit date: 05/06/2012 Discharge date: 05/07/2012  Admission Diagnoses: L3-4 and L4-5 dynamic degenerative spondylolisthesis, lumbar stenosis, left L3-4 synovial cyst, lumbar spondylosis  Discharge Diagnoses: L3-4 and L4-5 dynamic degenerative spondylolisthesis, lumbar stenosis, left L3-4 synovial cyst, lumbar spondylosis  Discharged Condition: good  Hospital Course:  Patient was admitted underwent an L3-L5 decompressive lumbar laminectomy, resection of her left L3-4 synovial cyst, and a 2 level L3-4 and L4-5 PLIF and PLA. She is done very well postoperatively. She's not required any narcotic medication, but rather has been managed with Toradol and IV acetaminophen. Her wound is healing nicely. She is voiding well since her Foley catheter was removed. She is up and ambulating actively. She's had excellent relief of her radicular pain. She is asking to be discharged home. She's been given instructions regarding wound care and activities following discharge. She is to return to my office for followup in 3 weeks. She asked for a prescription for tramadol (so as to be able to avoid narcotic analgesics) and we have given her prescription for 50 mg tablet, to take 1 or 2 tablets 4 times a day when necessary pain. We prescribed 100 tablets no refills.  Discharge Exam: Blood pressure 110/77, pulse 50, temperature 98.1 F (36.7 C), temperature source Oral, resp. rate 16, SpO2 98.00%.  Disposition:  Home   Medication List  As of 05/07/2012  8:19 AM   TAKE these medications         amLODipine 10 MG tablet   Commonly known as: NORVASC   Take 10 mg by mouth daily.      aspirin 325 MG tablet   Take 325 mg by mouth daily.      atenolol 25 MG tablet   Commonly known as: TENORMIN   Take 25 mg by mouth daily.      atorvastatin 40 MG tablet   Commonly known as: LIPITOR   Take 20 mg by mouth daily.     diclofenac 75 MG EC tablet   Commonly known as: VOLTAREN   Take 75 mg by mouth daily as needed. Swelling.      estradiol 0.075 mg/24hr   Commonly known as: CLIMARA - Dosed in mg/24 hr   Place 1 patch (0.075 mg total) onto the skin once a week.      HYDROcodone-acetaminophen 5-325 MG per tablet   Commonly known as: NORCO   Take 1-2 tablets by mouth every 4 (four) hours as needed for pain.      ibuprofen 200 MG tablet   Commonly known as: ADVIL,MOTRIN   Take 800 mg by mouth every 8 (eight) hours as needed. Pain/ swelling.  Only if she has not taken Diclofenac.      isosorbide mononitrate 60 MG 24 hr tablet   Commonly known as: IMDUR   Take 60 mg by mouth daily.      levothyroxine 75 MCG tablet   Commonly known as: SYNTHROID, LEVOTHROID   Take 75 mcg by mouth daily.      solifenacin 10 MG tablet   Commonly known as: VESICARE   Take 10 mg by mouth daily.             Signed: Hewitt Shorts, MD 05/07/2012, 8:19 AM

## 2012-05-07 NOTE — Plan of Care (Signed)
Problem: Consults Goal: Diagnosis - Spinal Surgery Outcome: Completed/Met Date Met:  05/07/12 Thoraco/Lumbar Spine Fusion     

## 2012-05-07 NOTE — Discharge Instructions (Signed)

## 2012-05-08 MED FILL — Sodium Chloride IV Soln 0.9%: INTRAVENOUS | Qty: 1000 | Status: AC

## 2012-05-08 MED FILL — Sodium Chloride Irrigation Soln 0.9%: Qty: 3000 | Status: AC

## 2012-05-08 MED FILL — Heparin Sodium (Porcine) Inj 1000 Unit/ML: INTRAMUSCULAR | Qty: 30 | Status: AC

## 2012-10-10 ENCOUNTER — Other Ambulatory Visit: Payer: Self-pay | Admitting: Obstetrics and Gynecology

## 2012-10-10 MED ORDER — ESTRADIOL 0.075 MG/24HR TD PTWK: 1.0000 | MEDICATED_PATCH | TRANSDERMAL | Status: DC

## 2012-11-22 ENCOUNTER — Ambulatory Visit (HOSPITAL_BASED_OUTPATIENT_CLINIC_OR_DEPARTMENT_OTHER): Payer: BC Managed Care – PPO | Attending: Otolaryngology

## 2012-11-22 VITALS — Ht 67.0 in | Wt 166.0 lb

## 2012-11-22 DIAGNOSIS — G4733 Obstructive sleep apnea (adult) (pediatric): Secondary | ICD-10-CM | POA: Insufficient documentation

## 2012-11-22 DIAGNOSIS — R259 Unspecified abnormal involuntary movements: Secondary | ICD-10-CM | POA: Insufficient documentation

## 2012-11-23 DIAGNOSIS — G4733 Obstructive sleep apnea (adult) (pediatric): Secondary | ICD-10-CM

## 2012-11-26 ENCOUNTER — Encounter: Payer: Self-pay | Admitting: Obstetrics and Gynecology

## 2012-11-26 ENCOUNTER — Ambulatory Visit (INDEPENDENT_AMBULATORY_CARE_PROVIDER_SITE_OTHER): Payer: BC Managed Care – PPO | Admitting: Obstetrics and Gynecology

## 2012-11-26 VITALS — BP 124/80 | Ht 67.0 in | Wt 170.0 lb

## 2012-11-26 DIAGNOSIS — I10 Essential (primary) hypertension: Secondary | ICD-10-CM | POA: Insufficient documentation

## 2012-11-26 DIAGNOSIS — I219 Acute myocardial infarction, unspecified: Secondary | ICD-10-CM | POA: Insufficient documentation

## 2012-11-26 DIAGNOSIS — Z01419 Encounter for gynecological examination (general) (routine) without abnormal findings: Secondary | ICD-10-CM

## 2012-11-26 DIAGNOSIS — I25119 Atherosclerotic heart disease of native coronary artery with unspecified angina pectoris: Secondary | ICD-10-CM | POA: Insufficient documentation

## 2012-11-26 DIAGNOSIS — E079 Disorder of thyroid, unspecified: Secondary | ICD-10-CM | POA: Insufficient documentation

## 2012-11-26 MED ORDER — ESTRADIOL 0.075 MG/24HR TD PTWK: 1.0000 | MEDICATED_PATCH | TRANSDERMAL | Status: DC

## 2012-11-26 NOTE — Patient Instructions (Signed)
Schedule mammogram.

## 2012-11-26 NOTE — Progress Notes (Signed)
Patient came to see me today for her annual GYN exam. In 2004 she had a total abdominal hysterectomy, bilateral salpingo-oophorectomy for fibroids. She has been on the Climara patch since then. She is having no vaginal or systemic symptoms. She is having no vaginal bleeding. She is having no pelvic pain. She is due for a mammogram. She had normal bone density last year. She has always had normal Pap smears. Her last Pap smear was 2012. She does her lab through her PCP.  HEENT: Within normal limits.Kennon Portela present Neck: No masses. Supraclavicular lymph nodes: Not enlarged. Breasts: Examined in both sitting and lying position. Symmetrical without skin changes or masses. Abdomen: Soft no masses guarding or rebound. No hernias. Pelvic: External within normal limits. BUS within normal limits. Vaginal examination shows good estrogen effect, no cystocele enterocele or rectocele. Cervix and uterus absent. Adnexa within normal limits. Rectovaginal confirmatory. Extremities within normal limits.  Assessment: Normal GYN exam  Plan: Continue Climara patch. Schedule mammogram. Pap not done.The new Pap smear guidelines were discussed with the patient.

## 2012-11-27 LAB — URINALYSIS W MICROSCOPIC + REFLEX CULTURE
Bacteria, UA: NONE SEEN
Bilirubin Urine: NEGATIVE
Casts: NONE SEEN
Crystals: NONE SEEN
Glucose, UA: NEGATIVE mg/dL
Hgb urine dipstick: NEGATIVE
Ketones, ur: NEGATIVE mg/dL
Leukocytes, UA: NEGATIVE
Nitrite: NEGATIVE
Protein, ur: NEGATIVE mg/dL
Specific Gravity, Urine: 1.013 (ref 1.005–1.030)
Squamous Epithelial / LPF: NONE SEEN
Urobilinogen, UA: 0.2 mg/dL (ref 0.0–1.0)
pH: 5.5 (ref 5.0–8.0)

## 2012-12-04 NOTE — Procedures (Signed)
Sonia Robertson, Sonia Robertson                ACCOUNT NO.:  [id number]  MEDICAL RECORD NO.:  [id number]          PATIENT TYPE:  OUT  LOCATION:  SLEEP CENTER                 FACILITY:  [facility]  PHYSICIAN:  Najae Filsaime D. Maple Hudson, MD, FCCP, FACPDATE OF BIRTH:  06-Jan-1956  DATE OF STUDY:  11/22/2012                           NOCTURNAL POLYSOMNOGRAM  REFERRING PHYSICIAN:  Hermelinda Medicus, M.D.  REFERRING PHYSICIAN:  Hermelinda Medicus, MD  INDICATION FOR STUDY:  Hypersomnia with sleep apnea.  EPWORTH SLEEPINESS SCORE:  10/24.  BMI 26, weight 166 pounds, height 67 inches, neck 14 inches.  MEDICATIONS:  Home medications are charted and reviewed.  SLEEP ARCHITECTURE:  Total sleep time 344.5 minutes with sleep efficiency 93.1%.  Stage I at 3.8%, stage II 69.2%, stage III absent, REM 27% of total sleep time.  Sleep latency 4.5 minutes, REM latency 78 minutes.  Awake after sleep onset 21 minutes.  Arousal index 8.9. Bedtime medication:  Aspirin, Lipitor.  RESPIRATORY DATA:  Apnea/hypopnea index (AHI) 15.7 per hour.  A total of 90 events was scored including 63 obstructive apneas, 3 central apneas, 7 mixed apneas, 17 hypopneas.  Events were nonsupine.  REM AHI 25.8 per hour.  The technician indicated there was not enough early sleep to meet protocol requirements for CPAP split titration on this study night.  OXYGEN DATA:  Extremely loud snoring with oxygen desaturation to a nadir of 86% and mean oxygen saturation through the study of 91.2% on room air.  CARDIAC DATA:  Sinus rhythm with frequent PACs.  MOVEMENT/PARASOMNIA:  A total of 81 limb jerks were counted, of which 7 were associated with arousal or awakening for periodic limb movement with arousal index of 1.2 per hour.  Bathroom x1.  IMPRESSION/RECOMMENDATION: 1. Moderate obstructive sleep apnea/hypopnea syndrome, AHI 15.7 per     hour with non-supine events.  Very loud snoring with oxygen     desaturation to a nadir of 86% and mean oxygen saturation  through     the study of 91.2% on room air. 2. The technician indicated the patient did not accumulate enough     sleep to meet protocol requirements in the first part of the study,     so the split protocol CPAP titration was not performed.  Consider     return for dedicated CPAP titration study or evaluate for     alternative management as clinically appropriate. 3. Periodic limb movement with arousal syndrome.  A total of 81 limb     jerks were counted, of which 7 were     associated with arousal or awakening for periodic limb movement     with arousal index of 1.2 per hour.  This can be addressed     clinically if it remains a problem after correction of sleep apnea.     Eldin Bonsell D. Maple Hudson, MD, [facility], FACP Diplomate, American Board of Sleep Medicine    CDY/MEDQ  D:  11/23/2012 13:18:51  T:  11/23/2012 40:98:11  Job:  [id number]

## 2013-02-12 ENCOUNTER — Encounter (HOSPITAL_COMMUNITY): Payer: Self-pay | Admitting: Pharmacy Technician

## 2013-02-19 ENCOUNTER — Encounter (HOSPITAL_COMMUNITY): Payer: BC Managed Care – PPO

## 2013-02-25 ENCOUNTER — Ambulatory Visit (HOSPITAL_BASED_OUTPATIENT_CLINIC_OR_DEPARTMENT_OTHER): Admit: 2013-02-25 | Payer: Self-pay | Admitting: Otolaryngology

## 2013-02-25 ENCOUNTER — Encounter (HOSPITAL_COMMUNITY): Admission: RE | Payer: Self-pay | Source: Ambulatory Visit

## 2013-02-25 ENCOUNTER — Ambulatory Visit (HOSPITAL_COMMUNITY): Admission: RE | Admit: 2013-02-25 | Payer: BC Managed Care – PPO | Source: Ambulatory Visit | Admitting: Otolaryngology

## 2013-02-25 ENCOUNTER — Encounter (HOSPITAL_BASED_OUTPATIENT_CLINIC_OR_DEPARTMENT_OTHER): Payer: Self-pay

## 2013-02-25 SURGERY — UPPP (UVULOPALATOPHARYNGOPLASTY)
Anesthesia: General

## 2013-11-06 ENCOUNTER — Other Ambulatory Visit: Payer: Self-pay | Admitting: Gynecology

## 2013-11-06 DIAGNOSIS — Z1231 Encounter for screening mammogram for malignant neoplasm of breast: Secondary | ICD-10-CM

## 2013-11-19 ENCOUNTER — Ambulatory Visit (HOSPITAL_COMMUNITY)
Admission: RE | Admit: 2013-11-19 | Discharge: 2013-11-19 | Disposition: A | Discharge status: Home / Self Care | Payer: BC Managed Care – PPO | Source: Ambulatory Visit | Attending: Gynecology | Admitting: Gynecology

## 2013-11-19 DIAGNOSIS — Z1231 Encounter for screening mammogram for malignant neoplasm of breast: Secondary | ICD-10-CM | POA: Insufficient documentation

## 2013-11-28 ENCOUNTER — Other Ambulatory Visit: Payer: Self-pay | Admitting: Gynecology

## 2013-11-28 DIAGNOSIS — R928 Other abnormal and inconclusive findings on diagnostic imaging of breast: Secondary | ICD-10-CM

## 2013-12-05 ENCOUNTER — Ambulatory Visit
Admission: RE | Admit: 2013-12-05 | Discharge: 2013-12-05 | Disposition: A | Discharge status: Home / Self Care | Payer: BC Managed Care – PPO | Source: Ambulatory Visit | Attending: Gynecology | Admitting: Gynecology

## 2013-12-05 DIAGNOSIS — R928 Other abnormal and inconclusive findings on diagnostic imaging of breast: Secondary | ICD-10-CM

## 2013-12-11 ENCOUNTER — Encounter: Payer: Self-pay | Admitting: Gynecology

## 2013-12-11 ENCOUNTER — Ambulatory Visit (INDEPENDENT_AMBULATORY_CARE_PROVIDER_SITE_OTHER): Payer: BC Managed Care – PPO | Admitting: Gynecology

## 2013-12-11 VITALS — BP 116/70 | Ht 66.5 in | Wt 171.2 lb

## 2013-12-11 DIAGNOSIS — N951 Menopausal and female climacteric states: Secondary | ICD-10-CM

## 2013-12-11 DIAGNOSIS — F17200 Nicotine dependence, unspecified, uncomplicated: Secondary | ICD-10-CM

## 2013-12-11 DIAGNOSIS — Z780 Asymptomatic menopausal state: Secondary | ICD-10-CM

## 2013-12-11 DIAGNOSIS — Z720 Tobacco use: Secondary | ICD-10-CM | POA: Insufficient documentation

## 2013-12-11 DIAGNOSIS — Z79890 Hormone replacement therapy (postmenopausal): Secondary | ICD-10-CM

## 2013-12-11 DIAGNOSIS — Z01419 Encounter for gynecological examination (general) (routine) without abnormal findings: Secondary | ICD-10-CM

## 2013-12-11 MED ORDER — VARENICLINE TARTRATE 0.5 MG PO TABS: ORAL_TABLET | ORAL | Status: DC

## 2013-12-11 MED ORDER — ESTRADIOL 0.075 MG/24HR TD PTWK: 0.0750 mg | MEDICATED_PATCH | TRANSDERMAL | Status: DC

## 2013-12-11 NOTE — Patient Instructions (Addendum)
Varenicline oral tablets What is this medicine? VARENICLINE (var EN i kleen) is used to help people quit smoking. It can reduce the symptoms caused by stopping smoking. It is used with a patient support program recommended by your physician. This medicine may be used for other purposes; ask your health care provider or pharmacist if you have questions. COMMON BRAND NAME(S): Chantix What should I tell my health care provider before I take this medicine? They need to know if you have any of these conditions: -bipolar disorder, depression, schizophrenia or other mental illness -heart disease -kidney disease -peripheral vascular disease -stroke -suicidal thoughts, plans, or attempt; a previous suicide attempt by you or a family member -an unusual or allergic reaction to varenicline, other medicines, foods, dyes, or preservatives -pregnant or trying to get pregnant -breast-feeding How should I use this medicine? You should set a date to stop smoking and tell your doctor. Start this medicine one week before the quit date. You can also start taking this medicine before you choose a quit date, and then pick a quit date that is between 8 and 35 days of treatment with this medicine. Stick to your plan; ask about support groups or other ways to help you remain a 'quitter'. Take this medicine by mouth after eating. Take with a full glass of water. Follow the directions on the prescription label. Take your doses at regular intervals. Do not take your medicine more often than directed. A special MedGuide will be given to you by the pharmacist with each prescription and refill. Be sure to read this information carefully each time. Talk to your pediatrician regarding the use of this medicine in children. This medicine is not approved for use in children. Overdosage: If you think you have taken too much of this medicine contact a poison control center or emergency room at once. NOTE: This medicine is only for  you. Do not share this medicine with others. What if I miss a dose? If you miss a dose, take it as soon as you can. If it is almost time for your next dose, take only that dose. Do not take double or extra doses. What may interact with this medicine? -insulin -other stop smoking aids -theophylline -warfarin This list may not describe all possible interactions. Give your health care provider a list of all the medicines, herbs, non-prescription drugs, or dietary supplements you use. Also tell them if you smoke, drink alcohol, or use illegal drugs. Some items may interact with your medicine. What should I watch for while using this medicine? Visit your doctor or health care professional for regular check ups. Ask for ongoing advice and encouragement from your doctor or healthcare professional, friends, and family to help you quit. If you smoke while on this medication, quit again Your mouth may get dry. Chewing sugarless gum or sucking hard candy, and drinking plenty of water may help. Contact your doctor if the problem does not go away or is severe. You may get drowsy or dizzy. Do not drive, use machinery, or do anything that needs mental alertness until you know how this medicine affects you. Do not stand or sit up quickly, especially if you are an older patient. This reduces the risk of dizzy or fainting spells. The use of this medicine may increase the chance of suicidal thoughts or actions. Pay special attention to how you are responding while on this medicine. Any worsening of mood, or thoughts of suicide or dying should be reported to your health  care professional right away. What side effects may I notice from receiving this medicine? Side effects that you should report to your doctor or health care professional as soon as possible: -allergic reactions like skin rash, itching or hives, swelling of the face, lips, tongue, or throat -breathing problems -changes in vision -chest pain or chest  tightness -confusion, trouble speaking or understanding -fast, irregular heartbeat -feeling faint or lightheaded, falls -fever -pain in legs when walking -problems with balance, talking, walking -ringing in ears -sudden numbness or weakness of the face, arm or leg -suicidal thoughts or other mood changes -trouble passing urine or change in the amount of urine -unusual bleeding or bruising -unusually weak or tired Side effects that usually do not require medical attention (report to your doctor or health care professional if they continue or are bothersome): -constipation -headache -nausea, vomiting -strange dreams -stomach gas -trouble sleeping This list may not describe all possible side effects. Call your doctor for medical advice about side effects. You may report side effects to FDA at 1-800-FDA-1088. Where should I keep my medicine? Keep out of the reach of children. Store at room temperature between 15 and 30 degrees C (59 and 86 degrees F). Throw away any unused medicine after the expiration date. NOTE: This sheet is a summary. It may not cover all possible information. If you have questions about this medicine, talk to your doctor, pharmacist, or health care provider.  2014, Elsevier/Gold Standard. (2010-06-27 15:12:38) Smoking Cessation Quitting smoking is important to your health and has many advantages. However, it is not always easy to quit since nicotine is a very addictive drug. Often times, people try 3 times or more before being able to quit. This document explains the best ways for you to prepare to quit smoking. Quitting takes hard work and a lot of effort, but you can do it. ADVANTAGES OF QUITTING SMOKING  You will live longer, feel better, and live better.  Your body will feel the impact of quitting smoking almost immediately.  Within 20 minutes, blood pressure decreases. Your pulse returns to its normal level.  After 8 hours, carbon monoxide levels in the  blood return to normal. Your oxygen level increases.  After 24 hours, the chance of having a heart attack starts to decrease. Your breath, hair, and body stop smelling like smoke.  After 48 hours, damaged nerve endings begin to recover. Your sense of taste and smell improve.  After 72 hours, the body is virtually free of nicotine. Your bronchial tubes relax and breathing becomes easier.  After 2 to 12 weeks, lungs can hold more air. Exercise becomes easier and circulation improves.  The risk of having a heart attack, stroke, cancer, or lung disease is greatly reduced.  After 1 year, the risk of coronary heart disease is cut in half.  After 5 years, the risk of stroke falls to the same as a nonsmoker.  After 10 years, the risk of lung cancer is cut in half and the risk of other cancers decreases significantly.  After 15 years, the risk of coronary heart disease drops, usually to the level of a nonsmoker.  If you are pregnant, quitting smoking will improve your chances of having a healthy baby.  The people you live with, especially any children, will be healthier.  You will have extra money to spend on things other than cigarettes. QUESTIONS TO THINK ABOUT BEFORE ATTEMPTING TO QUIT You may want to talk about your answers with your caregiver.  Why do  you want to quit?  If you tried to quit in the past, what helped and what did not?  What will be the most difficult situations for you after you quit? How will you plan to handle them?  Who can help you through the tough times? Your family? Friends? A caregiver?  What pleasures do you get from smoking? What ways can you still get pleasure if you quit? Here are some questions to ask your caregiver:  How can you help me to be successful at quitting?  What medicine do you think would be best for me and how should I take it?  What should I do if I need more help?  What is smoking withdrawal like? How can I get information on  withdrawal? GET READY  Set a quit date.  Change your environment by getting rid of all cigarettes, ashtrays, matches, and lighters in your home, car, or work. Do not let people smoke in your home.  Review your past attempts to quit. Think about what worked and what did not. GET SUPPORT AND ENCOURAGEMENT You have a better chance of being successful if you have help. You can get support in many ways.  Tell your family, friends, and co-workers that you are going to quit and need their support. Ask them not to smoke around you.  Get individual, group, or telephone counseling and support. Programs are available at [employer] and health centers. Call your local health department for information about programs in your area.  Spiritual beliefs and practices may help some smokers quit.  Download a "quit meter" on your computer to keep track of quit statistics, such as how long you have gone without smoking, cigarettes not smoked, and money saved.  Get a self-help book about quitting smoking and staying off of tobacco. [place] yourself from urges to smoke. Talk to someone, go for a walk, or occupy your time with a task.  Change your normal routine. Take a different route to work. Drink tea instead of coffee. Eat breakfast in a different place.  Reduce your stress. Take a hot bath, exercise, or read a book.  Plan something enjoyable to do every day. Reward yourself for not smoking.  Explore interactive web-based programs that specialize in helping you quit. GET MEDICINE AND USE IT CORRECTLY Medicines can help you stop smoking and decrease the urge to smoke. Combining medicine with the above behavioral methods and support can greatly increase your chances of successfully quitting smoking.  Nicotine replacement therapy helps deliver nicotine to your body without the negative effects and risks of smoking. Nicotine replacement therapy includes nicotine gum,  lozenges, inhalers, nasal sprays, and skin patches. Some may be available over-the-counter and others require a prescription.  Antidepressant medicine helps people abstain from smoking, but how this works is unknown. This medicine is available by prescription.  Nicotinic receptor partial agonist medicine simulates the effect of nicotine in your brain. This medicine is available by prescription. Ask your caregiver for advice about which medicines to use and how to use them based on your health history. Your caregiver will tell you what side effects to look out for if you choose to be on a medicine or therapy. Carefully read the information on the package. Do not use any other product containing nicotine while using a nicotine replacement product.  RELAPSE OR DIFFICULT SITUATIONS Most relapses occur within the first 3 months after quitting. Do not be discouraged if you start smoking again.  Remember, most people try several times before finally quitting. You may have symptoms of withdrawal because your body is used to nicotine. You may crave cigarettes, be irritable, feel very hungry, cough often, get headaches, or have difficulty concentrating. The withdrawal symptoms are only temporary. They are strongest when you first quit, but they will go away within 10 14 days. To reduce the chances of relapse, try to:  Avoid drinking alcohol. Drinking lowers your chances of successfully quitting. Reduce the amount of caffeine you consume. Once you quit smoking, the amount of caffeine in your body increases and can give you symptoms, such as a rapid heartbeat, sweating, and anxiety.Shingles Vaccine What You Need to Know WHAT IS SHINGLES?  Shingles is a painful skin rash, often with blisters. It is also called Herpes Zoster or just Zoster.  A shingles rash usually appears on one side of the face or body and lasts from 2 to 4 weeks. Its main symptom is pain, which can be quite severe. Other symptoms of shingles  can include fever, headache, chills, and upset stomach. Very rarely, a shingles infection can lead to pneumonia, hearing problems, blindness, brain inflammation (encephalitis), or death.  For about 1 person in 5, severe pain can continue even after the rash clears up. This is called post-herpetic neuralgia.  Shingles is caused by the Varicella Zoster virus. This is the same virus that causes chickenpox. Only someone who has had a case of chickenpox or rarely, has gotten chickenpox vaccine, can get shingles. The virus stays in your body. It can reappear many years later to cause a case of shingles.  You cannot catch shingles from another person with shingles. However, a person who has never had chickenpox (or chickenpox vaccine) could get chickenpox from someone with shingles. This is not very common.  Shingles is far more common in people 51 and older than in younger people. It is also more common in people whose immune systems are weakened because of a disease such as cancer or drugs such as steroids or chemotherapy.  At least 1 million people get shingles per year in the [place]. SHINGLES VACCINE  A vaccine for shingles was licensed in 6269. In clinical trials, the vaccine reduced the risk of shingles by 50%. It can also reduce the pain in people who still get shingles after being vaccinated.  A single dose of shingles vaccine is recommended for adults 20 years of age and older. SOME PEOPLE SHOULD NOT GET SHINGLES VACCINE OR SHOULD WAIT A person should not get shingles vaccine if he or she:  Has ever had a life-threatening allergic reaction to gelatin, the antibiotic neomycin, or any other component of shingles vaccine. Tell your caregiver if you have any severe allergies.  Has a weakened immune system because of current:  AIDS or another disease that affects the immune system.  Treatment with drugs that affect the immune system, such as prolonged use of high-dose steroids.  Cancer  treatment, such as radiation or chemotherapy.  Cancer affecting the bone marrow or lymphatic system, such as leukemia or lymphoma.  Is pregnant, or might be pregnant. Women should not become pregnant until at least 4 weeks after getting shingles vaccine. Someone with a minor illness, such as a cold, may be vaccinated. Anyone with a moderate or severe acute illness should usually wait until he or she recovers before getting the vaccine. This includes anyone with a temperature of 101.3 F (38 C) or higher. WHAT ARE THE RISKS FROM SHINGLES VACCINE?  A vaccine, like any medicine, could possibly cause serious problems, such as severe allergic reactions. However, the risk of a vaccine causing serious harm, or death, is extremely small.  No serious problems have been identified with shingles vaccine. Mild Problems  Redness, soreness, swelling, or itching at the site of the injection (about 1 person in 3).  Headache (about 1 person in 37). Like all vaccines, shingles vaccine is being closely monitored for unusual or severe problems. WHAT IF THERE IS A MODERATE OR SEVERE REACTION? What should I look for? Any unusual condition, such as a severe allergic reaction or a high fever. If a severe allergic reaction occurred, it would be within a few minutes to an hour after the shot. Signs of a serious allergic reaction can include difficulty breathing, weakness, hoarseness or wheezing, a fast heartbeat, hives, dizziness, paleness, or swelling of the throat. What should I do?  Call your caregiver, or get the person to a caregiver right away.  Tell the caregiver what happened, the date and time it happened, and when the vaccination was given.  Ask the caregiver to report the reaction by filing a Vaccine Adverse Event Reporting System (VAERS) form. Or, you can file this report through the VAERS web site at www.vaers.SamedayNews.es or by calling (830)030-1911. VAERS does not provide medical advice. HOW CAN I  LEARN MORE?  Ask your caregiver. He or she can give you the vaccine package insert or suggest other sources of information.  Contact the Centers for Disease Control and Prevention (CDC):  Call 306-728-9184 (1-800-CDC-INFO).  Visit the CDC website at http://hunter.com/ CDC Shingles Vaccine VIS (09/08/08) Document Released: 09/17/2006 Document Revised: 02/12/2012 Document Reviewed: 03/12/2013 [facility] Patient Information 2014 [place].    Avoid smokers because they can make you want to smoke.  Do not let weight gain distract you. Many smokers will gain weight when they quit, usually less than 10 pounds. Eat a healthy diet and stay active. You can always lose the weight gained after you quit.  Find ways to improve your mood other than smoking. FOR MORE INFORMATION  www.smokefree.gov  Document Released: 11/14/2001 Document Revised: 05/21/2012 Document Reviewed: 02/29/2012 [facility] Patient Information 2014 [place], [place].

## 2013-12-11 NOTE — Progress Notes (Signed)
Sonia Robertson Mar 24, 1956 [id number]   History:    58 y.o.  for annual gyn exam no complaints today.2004 she had a total abdominal hysterectomy, bilateral salpingo-oophorectomy for fibroids. She has been on the Cl[place]ara patch since then. She is having no vaginal or systemic symptoms. She is having no vaginal bleeding. She is having no pelvic pain. Patient is a chronic smoker one pack of cigarettes per day. Patient with no prior history of abnormal Pap smears. She has not received her shingles vaccine but has received the flu vaccine. Her last bone density study was normal in 2012. Patient will be due for a colonoscopy this year. Her primary physician is Dr. Leta Jungling.  Patient has been on ERT since 2004   Past medical history,surgical history, family history and social history were all reviewed and documented in the EPIC chart.  Gynecologic History No LMP recorded. Patient has had a hysterectomy. Contraception: status post hysterectomy Last Pap: 2012. Results were: normal Last mammogram: 2015. Results were: normal  Obstetric History OB History  Gravida Para Term Preterm AB SAB TAB Ectopic Multiple Living  3 2 2  1     2     # Outcome Date GA Lbr Len/2nd Weight Sex Delivery Anes PTL Lv  3 ABT           2 TRM           1 TRM                ROS: A ROS was performed and pertinent positives and negatives are included in the history.  GENERAL: No fevers or chills. HEENT: No change in vision, no earache, sore throat or sinus congestion. NECK: No pain or stiffness. CARDIOVASCULAR: No chest pain or pressure. No palpitations. PULMONARY: No shortness of breath, cough or wheeze. GASTROINTESTINAL: No abdominal pain, nausea, vomiting or diarrhea, melena or bright red blood per rectum. GENITOURINARY: No urinary frequency, urgency, hesitancy or dysuria. MUSCULOSKELETAL: No joint or muscle pain, no back pain, no recent trauma. DERMATOLOGIC: No rash, no itching, no lesions. ENDOCRINE: No polyuria,  polydipsia, no heat or cold intolerance. No recent change in weight. HEMATOLOGICAL: No anemia or easy bruising or bleeding. NEUROLOGIC: No headache, seizures, numbness, tingling or weakness. PSYCHIATRIC: No depression, no loss of interest in normal activity or change in sleep pattern.     Exam: chaperone present  BP 116/70  Ht 5' 6.5" (1.689 m)  Wt 171 lb 3.2 oz (77.656 kg)  BMI 27.22 kg/m2  Body mass index is 27.22 kg/(m^2).  General appearance : Well developed well nourished female. No acute distress HEENT: Neck supple, trachea midline, no carotid bruits, no thyroidmegaly Lungs: Clear to auscultation, no rhonchi or wheezes, or rib retractions  Heart: Regular rate and rhythm, no murmurs or gallops Breast:Examined in sitting and supine position were symmetrical in appearance, no palpable masses or tenderness,  no skin retraction, no nipple inversion, no nipple discharge, no skin discoloration, no axillary or supraclavicular lymphadenopathy Abdomen: no palpable masses or tenderness, no rebound or guarding Extremities: no edema or skin discoloration or tenderness  Pelvic:  Bartholin, Urethra, Skene Glands: Within normal limits             Vagina: No gross lesions or discharge  Cervix: absent  Uterus  Absent  Adnexa  Without masses or tenderness  Anus and perineum  normal   Rectovaginal  normal sphincter tone without palpated masses or tenderness  Hemoccult PCP provides     Assessment/Plan:  58 y.o. female for annual exam who was counseled on the detrimental effects of smoking. Patient several years ago had taken Chantix and would like to try again. She will be prescribed Chantix today risks benefits and pros and cons were discussed. Pap smear not done today the new guidelines were discussed. Prescription for shingles vaccine was provided today. We discussed importance of calcium vitamin D and regular exercise for osteoporosis prevention. She will schedule her next bone  density study in the next few months here in our office. She would need to schedule a colonoscopy for this year.  Note: This dictation was prepared with  Dragon/digital dictation along withSmart phrase technology. Any transcriptional errors that result from this process are unintentional.   Terrance Mass MD, 1:19 PM 12/11/2013

## 2013-12-29 ENCOUNTER — Other Ambulatory Visit: Payer: Self-pay | Admitting: Gynecology

## 2013-12-29 ENCOUNTER — Ambulatory Visit (INDEPENDENT_AMBULATORY_CARE_PROVIDER_SITE_OTHER): Payer: BC Managed Care – PPO

## 2013-12-29 DIAGNOSIS — Z13820 Encounter for screening for osteoporosis: Secondary | ICD-10-CM

## 2013-12-29 DIAGNOSIS — F17200 Nicotine dependence, unspecified, uncomplicated: Secondary | ICD-10-CM

## 2013-12-29 DIAGNOSIS — Z79890 Hormone replacement therapy (postmenopausal): Secondary | ICD-10-CM

## 2013-12-29 DIAGNOSIS — Z780 Asymptomatic menopausal state: Secondary | ICD-10-CM

## 2014-01-23 ENCOUNTER — Other Ambulatory Visit: Payer: Self-pay | Admitting: Interventional Cardiology

## 2014-01-26 ENCOUNTER — Ambulatory Visit: Payer: BC Managed Care – PPO | Admitting: Interventional Cardiology

## 2014-03-09 ENCOUNTER — Other Ambulatory Visit: Payer: Self-pay | Admitting: Interventional Cardiology

## 2014-03-10 ENCOUNTER — Ambulatory Visit (INDEPENDENT_AMBULATORY_CARE_PROVIDER_SITE_OTHER): Payer: BC Managed Care – PPO | Admitting: Interventional Cardiology

## 2014-03-10 ENCOUNTER — Encounter: Payer: Self-pay | Admitting: Interventional Cardiology

## 2014-03-10 VITALS — BP 126/86 | HR 53 | Ht 67.5 in | Wt 173.1 lb

## 2014-03-10 DIAGNOSIS — I209 Angina pectoris, unspecified: Secondary | ICD-10-CM

## 2014-03-10 DIAGNOSIS — F17200 Nicotine dependence, unspecified, uncomplicated: Secondary | ICD-10-CM

## 2014-03-10 DIAGNOSIS — E785 Hyperlipidemia, unspecified: Secondary | ICD-10-CM

## 2014-03-10 DIAGNOSIS — E079 Disorder of thyroid, unspecified: Secondary | ICD-10-CM

## 2014-03-10 DIAGNOSIS — I10 Essential (primary) hypertension: Secondary | ICD-10-CM

## 2014-03-10 NOTE — Patient Instructions (Signed)
Your physician recommends that you continue on your current medications as directed. Please refer to the Current Medication list given to you today.  Your physician recommends that you return for a FASTING lipid profile and alt on 03/12/14 between 7:30am-5:15pm  Your physician wants you to follow-up in: 1 year You will receive a reminder letter in the mail two months in advance. If you don't receive a letter, please call our office to schedule the follow-up appointment.

## 2014-03-10 NOTE — Progress Notes (Signed)
Patient ID: Sonia Robertson, female   DOB: 13-Oct-1956, 58 y.o.   MRN: [id number]    [address], [address] [place], [place]  [address] Phone: [phone] Fax:  [phone]  Date:  03/10/2014   ID:  Sonia Robertson, DOB 07-13-1956, MRN [id number]  PCP:  Mathews Argyle, MD   ASSESSMENT:  1. coronary disease stable. No angina over the mobile 2. Hypertension, controlled 3. Hyperlipidemia not recently assessed  PLAN:  1. Fasting Lipid panel and ALT 2. Clinical followup in one year 3. Active lifestyle   SUBJECTIVE: Sonia Robertson is a 58 y.o. female who is having no cardiovascular complaints. She has not had syncope or chest pain. She denies dyspnea. She continues to smoke. Occasional left hip and groin discomfort but she feels is related to lumbar disc disease. She denies orthopnea, PND, claudication. No transient neurological symptoms.   Wt Readings from Last 3 Encounters:  03/10/14 173 lb 1.9 oz (78.527 kg)  12/11/13 171 lb 3.2 oz (77.656 kg)  11/26/12 170 lb (77.111 kg)     Past Medical History  Diagnosis Date  . Fibroid   . Myocardial infarction     1988  . Angina     NONE SINCE MARCH   SEES DR Linard Millers    . Shortness of breath     WITH EXERTION  . Hypertension   . Thyroid disease   . Hypothyroidism     Current Outpatient Prescriptions  Medication Sig Dispense Refill  . amLODipine (NORVASC) 10 MG tablet Take 10 mg by mouth daily.        Marland Kitchen aspirin 325 MG tablet Take 325 mg by mouth daily.        Marland Kitchen atenolol (TENORMIN) 25 MG tablet Take 25 mg by mouth daily.        Marland Kitchen atorvastatin (LIPITOR) 40 MG tablet Take 20 mg by mouth daily.       . Calcium Carbonate-Vit D-Min (CALCIUM 1200 PO) Take 1 tablet by mouth daily.      . Coenzyme Q10 (COQ10) 200 MG CAPS Take 1 capsule by mouth daily.      Marland Kitchen estradiol (CLIMARA - DOSED IN MG/24 HR) 0.075 mg/24hr patch Place 1 patch (0.075 mg total) onto the skin once a week. Take on sundays  4 patch  11  . ibuprofen (ADVIL,MOTRIN)  200 MG tablet Take 800 mg by mouth every 8 (eight) hours as needed. Pain/ swelling.  Only if she has not taken Diclofenac.      . isosorbide mononitrate (IMDUR) 60 MG 24 hr tablet TAKE 1 BY MOUTH DAILY  90 tablet  0  . levothyroxine (SYNTHROID, LEVOTHROID) 88 MCG tablet Take 88 mcg by mouth daily before breakfast.      . Multiple Vitamin (MULTIVITAMIN WITH MINERALS) TABS Take 1 tablet by mouth daily.      . solifenacin (VESICARE) 10 MG tablet Take 10 mg by mouth daily.        . varenicline (CHANTIX) 0.5 MG tablet Chantix 0.5 mg PO qd x 3 days, then 0.5 mg PO BID x 4 days then 1 mg BID  60 tablet  2  . Vitamin D, Cholecalciferol, 1000 UNITS TABS Take 1 tablet by mouth daily.       No current facility-administered medications for this visit.    Allergies:    Allergies  Allergen Reactions  . Morphine And Related Nausea And Vomiting    Social History:  The patient  reports that she  has been smoking Cigarettes.  She has been smoking about 1.00 pack per day. She does not have any smokeless tobacco history on file. She reports that she does not drink alcohol or use illicit drugs.   ROS:  Please see the history of present illness.   Left hip discomfort   All other systems reviewed and negative.   OBJECTIVE: VS:  BP 126/86  Pulse 53  Ht 5' 7.5" (1.715 m)  Wt 173 lb 1.9 oz (78.527 kg)  BMI 26.70 kg/m2 Well nourished, well developed, in no acute distress, compatible with stated age HEENT: normal Neck: JVD flat. Carotid bruit absent  Cardiac:  normal S1, S2; RRR; no murmur Lungs:  clear to auscultation bilaterally, no wheezing, rhonchi or rales Abd: soft, nontender, no hepatomegaly Ext: Edema absent. Pulses 2+ and symmetric Skin: warm and dry Neuro:  CNs 2-12 intact, no focal abnormalities noted  EKG:  Normal with the exception of QS pattern V1 and V2       Signed, Illene Labrador III, MD 03/10/2014 2:22 PM

## 2014-03-12 ENCOUNTER — Other Ambulatory Visit (INDEPENDENT_AMBULATORY_CARE_PROVIDER_SITE_OTHER): Payer: BC Managed Care – PPO

## 2014-03-12 DIAGNOSIS — E785 Hyperlipidemia, unspecified: Secondary | ICD-10-CM

## 2014-03-12 LAB — LIPID PANEL
Cholesterol: 126 mg/dL (ref 0–200)
HDL: 41.2 mg/dL (ref >39.00)
LDL Cholesterol: 75 mg/dL (ref 0–99)
Total CHOL/HDL Ratio: 3
Triglycerides: 47 mg/dL (ref 0.0–149.0)
VLDL: 9.4 mg/dL (ref 0.0–40.0)

## 2014-03-12 LAB — ALT: ALT: 12 U/L (ref 0–35)

## 2014-03-19 ENCOUNTER — Telehealth: Payer: Self-pay

## 2014-03-19 DIAGNOSIS — E785 Hyperlipidemia, unspecified: Secondary | ICD-10-CM

## 2014-03-19 NOTE — Telephone Encounter (Signed)
lmom.will mail copy of labs to pt and fwd results to pcp

## 2014-03-19 NOTE — Telephone Encounter (Signed)
Message copied by Lamar Laundry on Thu Mar 19, 2014  3:47 PM ------      Message from: Daneen Schick      Created: Thu Mar 12, 2014  4:30 PM       Outstanding. Copy results to PCP. Needs repeat in 1 year with PCP or [place]. ------

## 2014-05-05 ENCOUNTER — Other Ambulatory Visit: Payer: Self-pay | Admitting: Gynecology

## 2014-05-05 DIAGNOSIS — R921 Mammographic calcification found on diagnostic imaging of breast: Secondary | ICD-10-CM

## 2014-05-24 ENCOUNTER — Other Ambulatory Visit: Payer: Self-pay | Admitting: *Deleted

## 2014-05-24 DIAGNOSIS — E785 Hyperlipidemia, unspecified: Secondary | ICD-10-CM

## 2014-05-24 DIAGNOSIS — Z79899 Other long term (current) drug therapy: Secondary | ICD-10-CM

## 2014-06-08 ENCOUNTER — Ambulatory Visit
Admission: RE | Admit: 2014-06-08 | Discharge: 2014-06-08 | Disposition: A | Discharge status: Home / Self Care | Payer: BC Managed Care – PPO | Source: Ambulatory Visit | Attending: Gynecology | Admitting: Gynecology

## 2014-06-08 DIAGNOSIS — R921 Mammographic calcification found on diagnostic imaging of breast: Secondary | ICD-10-CM

## 2014-06-16 ENCOUNTER — Other Ambulatory Visit (INDEPENDENT_AMBULATORY_CARE_PROVIDER_SITE_OTHER): Payer: BC Managed Care – PPO

## 2014-06-16 DIAGNOSIS — E785 Hyperlipidemia, unspecified: Secondary | ICD-10-CM

## 2014-06-16 DIAGNOSIS — Z79899 Other long term (current) drug therapy: Secondary | ICD-10-CM

## 2014-06-16 LAB — LIPID PANEL
Cholesterol: 111 mg/dL (ref 0–200)
HDL: 40.5 mg/dL (ref >39.00)
LDL Cholesterol: 60 mg/dL (ref 0–99)
NonHDL: 70.5
Total CHOL/HDL Ratio: 3
Triglycerides: 54 mg/dL (ref 0.0–149.0)
VLDL: 10.8 mg/dL (ref 0.0–40.0)

## 2014-06-16 LAB — LDL CHOLESTEROL, DIRECT: Direct LDL: 68.6 mg/dL

## 2014-06-16 LAB — ALT: ALT: 16 U/L (ref 0–35)

## 2014-06-26 ENCOUNTER — Telehealth: Payer: Self-pay

## 2014-06-26 NOTE — Telephone Encounter (Signed)
lmom labs normal 

## 2014-07-04 ENCOUNTER — Encounter: Payer: Self-pay | Admitting: Interventional Cardiology

## 2014-08-24 ENCOUNTER — Telehealth: Payer: Self-pay

## 2014-08-24 ENCOUNTER — Other Ambulatory Visit: Payer: Self-pay

## 2014-08-24 MED ORDER — NITROGLYCERIN 0.4 MG SL SUBL: 0.4000 mg | SUBLINGUAL_TABLET | SUBLINGUAL | Status: DC | PRN

## 2014-08-24 NOTE — Telephone Encounter (Signed)
Ok to give pt. Pt had this refilled by Dr Tamala Julian in 2013

## 2014-10-05 ENCOUNTER — Encounter: Payer: Self-pay | Admitting: Interventional Cardiology

## 2014-10-05 ENCOUNTER — Other Ambulatory Visit: Payer: Self-pay | Admitting: Gynecology

## 2014-10-05 DIAGNOSIS — R921 Mammographic calcification found on diagnostic imaging of breast: Secondary | ICD-10-CM

## 2014-11-23 ENCOUNTER — Encounter (INDEPENDENT_AMBULATORY_CARE_PROVIDER_SITE_OTHER): Payer: Self-pay

## 2014-11-23 ENCOUNTER — Ambulatory Visit
Admission: RE | Admit: 2014-11-23 | Discharge: 2014-11-23 | Disposition: A | Discharge status: Home / Self Care | Payer: BC Managed Care – PPO | Source: Ambulatory Visit | Attending: Gynecology | Admitting: Gynecology

## 2014-11-23 DIAGNOSIS — R921 Mammographic calcification found on diagnostic imaging of breast: Secondary | ICD-10-CM

## 2014-12-14 ENCOUNTER — Ambulatory Visit (INDEPENDENT_AMBULATORY_CARE_PROVIDER_SITE_OTHER): Payer: BLUE CROSS/BLUE SHIELD | Admitting: Gynecology

## 2014-12-14 ENCOUNTER — Encounter: Payer: Self-pay | Admitting: Gynecology

## 2014-12-14 VITALS — BP 126/84 | Ht 66.5 in | Wt 175.0 lb

## 2014-12-14 DIAGNOSIS — F17200 Nicotine dependence, unspecified, uncomplicated: Secondary | ICD-10-CM

## 2014-12-14 DIAGNOSIS — N3281 Overactive bladder: Secondary | ICD-10-CM

## 2014-12-14 DIAGNOSIS — Z780 Asymptomatic menopausal state: Secondary | ICD-10-CM

## 2014-12-14 DIAGNOSIS — Z79890 Hormone replacement therapy (postmenopausal): Secondary | ICD-10-CM

## 2014-12-14 DIAGNOSIS — Z01419 Encounter for gynecological examination (general) (routine) without abnormal findings: Secondary | ICD-10-CM

## 2014-12-14 DIAGNOSIS — Z720 Tobacco use: Secondary | ICD-10-CM

## 2014-12-14 MED ORDER — ESTRADIOL 0.05 MG/24HR TD PTWK: 0.0500 mg | MEDICATED_PATCH | TRANSDERMAL | Status: DC

## 2014-12-14 MED ORDER — MIRABEGRON ER 50 MG PO TB24: 50.0000 mg | ORAL_TABLET | Freq: Every day | ORAL | Status: DC

## 2014-12-14 NOTE — Progress Notes (Signed)
Sonia Robertson Apr 07, 1956 [id number]   History:    59 y.o.  for annual gyn exam with no complaints today. Patient continues to smoke a pack a day despite having been counseled in the past. She is on Climara transdermal patch 0.075 every weekly. Review of her record indicated in 2004 she had a total abdominal hysterectomy with bilateral salpingo-oophorectomy for symptomatic uterine fibroids. Patient with no past history of any abnormal Pap smears. Her shingles vaccine is up-to-date. Her last bone density study was normal in 2015. She states she is taking her calcium and vitamin D. Her primary care physician is been doing her blood work Dr. Felipa Eth. Her colonoscopy was in 2011 she states that benign polyps were removed. Patient also has suffer from overactive bladder and had been on Vesicare 10 mg daily and is states it is not helping her much and she's had eye irritation and dryness while she has been on it. She's been followed by her ophthalmologist. Also the Vesicare is too costly  Past medical history,surgical history, family history and social history were all reviewed and documented in the EPIC chart.  Gynecologic History No LMP recorded. Patient has had a hysterectomy. Contraception: status post hysterectomy Last Pap: 2012. Results were: normal Last mammogram: December 2015. Results were: Normal but dense breasts  Obstetric History OB History  Gravida Para Term Preterm AB SAB TAB Ectopic Multiple Living  3 2 2  1     2     # Outcome Date GA Lbr Len/2nd Weight Sex Delivery Anes PTL Lv  3 AB           2 Term           1 Term                ROS: A ROS was performed and pertinent positives and negatives are included in the history.  GENERAL: No fevers or chills. HEENT: No change in vision, no earache, sore throat or sinus congestion. NECK: No pain or stiffness. CARDIOVASCULAR: No chest pain or pressure. No palpitations. PULMONARY: No shortness of breath, cough or wheeze.  GASTROINTESTINAL: No abdominal pain, nausea, vomiting or diarrhea, melena or bright red blood per rectum. GENITOURINARY: No urinary frequency, urgency, hesitancy or dysuria. MUSCULOSKELETAL: No joint or muscle pain, no back pain, no recent trauma. DERMATOLOGIC: No rash, no itching, no lesions. ENDOCRINE: No polyuria, polydipsia, no heat or cold intolerance. No recent change in weight. HEMATOLOGICAL: No anemia or easy bruising or bleeding. NEUROLOGIC: No headache, seizures, numbness, tingling or weakness. PSYCHIATRIC: No depression, no loss of interest in normal activity or change in sleep pattern.     Exam: chaperone present  BP 126/84 mmHg  Ht 5' 6.5" (1.689 m)  Wt 175 lb (79.379 kg)  BMI 27.83 kg/m2  Body mass index is 27.83 kg/(m^2).  General appearance : Well developed well nourished female. No acute distress HEENT: Neck supple, trachea midline, no carotid bruits, no thyroidmegaly Lungs: Clear to auscultation, no rhonchi or wheezes, or rib retractions  Heart: Regular rate and rhythm, no murmurs or gallops Breast:Examined in sitting and supine position were symmetrical in appearance, no palpable masses or tenderness,  no skin retraction, no nipple inversion, no nipple discharge, no skin discoloration, no axillary or supraclavicular lymphadenopathy Abdomen: no palpable masses or tenderness, no rebound or guarding Extremities: no edema or skin discoloration or tenderness  Pelvic:  Bartholin, Urethra, Skene Glands: Within normal limits  Vagina: No gross lesions or discharge  Cervix: Absent  Uterus  Absent  Adnexa  Without masses or tenderness  Anus and perineum  normal   Rectovaginal  normal sphincter tone without palpated masses or tenderness             Hemoccult will follow-up with PCP to schedule colonoscopy for this year     Assessment/Plan:  59 y.o. female for annual exam who once again was counseled on the detrimental effects of smoking. We are going to begin  tapering down her estrogen patch from 0.075 to 0.05 every weekly. She was reminded of the importance of calcium vitamin D and regular exercise for osteoporosis prevention. She is also reminded on the importance of monthly self breast examination. She will schedule colonoscopy for this year. Pap smear no longer needed. Patient will be switched from Vesicare to Myrbetriq 50 mg every daily. Mechanism of action is different then the anti-cholinergic that she was on and hopefully this will help as well with her dryness in her eyes.Uvaldo Rising H MD, 5:17 PM 12/14/2014

## 2014-12-14 NOTE — Patient Instructions (Signed)
Smoking Cessation Quitting smoking is important to your health and has many advantages. However, it is not always easy to quit since nicotine is a very addictive drug. Oftentimes, people try 3 times or more before being able to quit. This document explains the best ways for you to prepare to quit smoking. Quitting takes hard work and a lot of effort, but you can do it. ADVANTAGES OF QUITTING SMOKING  You will live longer, feel better, and live better.  Your body will feel the impact of quitting smoking almost immediately.  Within 20 minutes, blood pressure decreases. Your pulse returns to its normal level.  After 8 hours, carbon monoxide levels in the blood return to normal. Your oxygen level increases.  After 24 hours, the chance of having a heart attack starts to decrease. Your breath, hair, and body stop smelling like smoke.  After 48 hours, damaged nerve endings begin to recover. Your sense of taste and smell improve.  After 72 hours, the body is virtually free of nicotine. Your bronchial tubes relax and breathing becomes easier.  After 2 to 12 weeks, lungs can hold more air. Exercise becomes easier and circulation improves.  The risk of having a heart attack, stroke, cancer, or lung disease is greatly reduced.  After 1 year, the risk of coronary heart disease is cut in half.  After 5 years, the risk of stroke falls to the same as a nonsmoker.  After 10 years, the risk of lung cancer is cut in half and the risk of other cancers decreases significantly.  After 15 years, the risk of coronary heart disease drops, usually to the level of a nonsmoker.  If you are pregnant, quitting smoking will improve your chances of having a healthy baby.  The people you live with, especially any children, will be healthier.  You will have extra money to spend on things other than cigarettes. QUESTIONS TO THINK ABOUT BEFORE ATTEMPTING TO QUIT You may want to talk about your answers with your  health care provider.  Why do you want to quit?  If you tried to quit in the past, what helped and what did not?  What will be the most difficult situations for you after you quit? How will you plan to handle them?  Who can help you through the tough times? Your family? Friends? A health care provider?  What pleasures do you get from smoking? What ways can you still get pleasure if you quit? Here are some questions to ask your health care provider:  How can you help me to be successful at quitting?  What medicine do you think would be best for me and how should I take it?  What should I do if I need more help?  What is smoking withdrawal like? How can I get information on withdrawal? GET READY  Set a quit date.  Change your environment by getting rid of all cigarettes, ashtrays, matches, and lighters in your home, car, or work. Do not let people smoke in your home.  Review your past attempts to quit. Think about what worked and what did not. GET SUPPORT AND ENCOURAGEMENT You have a better chance of being successful if you have help. You can get support in many ways.  Tell your family, friends, and coworkers that you are going to quit and need their support. Ask them not to smoke around you.  Get individual, group, or telephone counseling and support. Programs are available at local hospitals and health centers. Call   your local health department for information about programs in your area.  Spiritual beliefs and practices may help some smokers quit.  Download a "quit meter" on your computer to keep track of quit statistics, such as how long you have gone without smoking, cigarettes not smoked, and money saved.  Get a self-help book about quitting smoking and staying off tobacco. LEARN NEW SKILLS AND BEHAVIORS  Distract yourself from urges to smoke. Talk to someone, go for a walk, or occupy your time with a task.  Change your normal routine. Take a different route to work.  Drink tea instead of coffee. Eat breakfast in a different place.  Reduce your stress. Take a hot bath, exercise, or read a book.  Plan something enjoyable to do every day. Reward yourself for not smoking.  Explore interactive web-based programs that specialize in helping you quit. GET MEDICINE AND USE IT CORRECTLY Medicines can help you stop smoking and decrease the urge to smoke. Combining medicine with the above behavioral methods and support can greatly increase your chances of successfully quitting smoking.  Nicotine replacement therapy helps deliver nicotine to your body without the negative effects and risks of smoking. Nicotine replacement therapy includes nicotine gum, lozenges, inhalers, nasal sprays, and skin patches. Some may be available over-the-counter and others require a prescription.  Antidepressant medicine helps people abstain from smoking, but how this works is unknown. This medicine is available by prescription.  Nicotinic receptor partial agonist medicine simulates the effect of nicotine in your brain. This medicine is available by prescription. Ask your health care provider for advice about which medicines to use and how to use them based on your health history. Your health care provider will tell you what side effects to look out for if you choose to be on a medicine or therapy. Carefully read the information on the package. Do not use any other product containing nicotine while using a nicotine replacement product.  RELAPSE OR DIFFICULT SITUATIONS Most relapses occur within the first 3 months after quitting. Do not be discouraged if you start smoking again. Remember, most people try several times before finally quitting. You may have symptoms of withdrawal because your body is used to nicotine. You may crave cigarettes, be irritable, feel very hungry, cough often, get headaches, or have difficulty concentrating. The withdrawal symptoms are only temporary. They are strongest  when you first quit, but they will go away within 10-14 days. To reduce the chances of relapse, try to:  Avoid drinking alcohol. Drinking lowers your chances of successfully quitting.  Reduce the amount of caffeine you consume. Once you quit smoking, the amount of caffeine in your body increases and can give you symptoms, such as a rapid heartbeat, sweating, and anxiety.  Avoid smokers because they can make you want to smoke.  Do not let weight gain distract you. Many smokers will gain weight when they quit, usually less than 10 pounds. Eat a healthy diet and stay active. You can always lose the weight gained after you quit.  Find ways to improve your mood other than smoking. FOR MORE INFORMATION  www.smokefree.gov  Document Released: 11/14/2001 Document Revised: 04/06/2014 Document Reviewed: 02/29/2012 [employer] Patient Information 2015 [employer]. This information is not intended to replace advice given to you by your health care provider. Make sure you discuss any questions you have with your health care provider.  

## 2014-12-29 ENCOUNTER — Other Ambulatory Visit: Payer: Self-pay | Admitting: Interventional Cardiology

## 2015-03-05 ENCOUNTER — Other Ambulatory Visit (INDEPENDENT_AMBULATORY_CARE_PROVIDER_SITE_OTHER): Payer: BLUE CROSS/BLUE SHIELD | Admitting: *Deleted

## 2015-03-05 DIAGNOSIS — E785 Hyperlipidemia, unspecified: Secondary | ICD-10-CM | POA: Diagnosis not present

## 2015-03-05 LAB — HEPATIC FUNCTION PANEL
ALT: 23 U/L (ref 0–35)
AST: 18 U/L (ref 0–37)
Albumin: 4.4 g/dL (ref 3.5–5.2)
Alkaline Phosphatase: 66 U/L (ref 39–117)
Bilirubin, Direct: 0.2 mg/dL (ref 0.0–0.3)
Total Bilirubin: 0.9 mg/dL (ref 0.2–1.2)
Total Protein: 7.1 g/dL (ref 6.0–8.3)

## 2015-03-05 NOTE — Addendum Note (Signed)
Addended by: Eulis Foster on: 03/05/2015 08:02 AM   Modules accepted: Orders

## 2015-03-10 ENCOUNTER — Other Ambulatory Visit (INDEPENDENT_AMBULATORY_CARE_PROVIDER_SITE_OTHER): Payer: BLUE CROSS/BLUE SHIELD

## 2015-03-10 DIAGNOSIS — R937 Abnormal findings on diagnostic imaging of other parts of musculoskeletal system: Secondary | ICD-10-CM | POA: Diagnosis not present

## 2015-03-10 LAB — LIPID PANEL
Cholesterol: 165 mg/dL (ref 0–200)
HDL: 45.1 mg/dL (ref >39.00)
LDL Cholesterol: 106 mg/dL — ABNORMAL HIGH (ref 0–99)
NonHDL: 119.9
Total CHOL/HDL Ratio: 4
Triglycerides: 68 mg/dL (ref 0.0–149.0)
VLDL: 13.6 mg/dL (ref 0.0–40.0)

## 2015-03-12 ENCOUNTER — Ambulatory Visit (INDEPENDENT_AMBULATORY_CARE_PROVIDER_SITE_OTHER): Payer: BLUE CROSS/BLUE SHIELD | Admitting: Interventional Cardiology

## 2015-03-12 ENCOUNTER — Encounter: Payer: Self-pay | Admitting: Interventional Cardiology

## 2015-03-12 VITALS — BP 106/82 | HR 66 | Ht 66.5 in | Wt 176.8 lb

## 2015-03-12 DIAGNOSIS — I10 Essential (primary) hypertension: Secondary | ICD-10-CM

## 2015-03-12 DIAGNOSIS — I209 Angina pectoris, unspecified: Secondary | ICD-10-CM

## 2015-03-12 DIAGNOSIS — E079 Disorder of thyroid, unspecified: Secondary | ICD-10-CM

## 2015-03-12 DIAGNOSIS — Z720 Tobacco use: Secondary | ICD-10-CM | POA: Diagnosis not present

## 2015-03-12 DIAGNOSIS — F17200 Nicotine dependence, unspecified, uncomplicated: Secondary | ICD-10-CM

## 2015-03-12 NOTE — Patient Instructions (Signed)
Your physician recommends that you continue on your current medications as directed. Please refer to the Current Medication list given to you today.  Your physician discussed the hazards of tobacco use. Tobacco use cessation is recommended and techniques and options to help you quit were discussed.  Your physician discussed the importance of regular exercise and recommended that you start or continue a regular exercise program for good health.   Your physician wants you to follow-up in: 1 year with Dr.Smith You will receive a reminder letter in the mail two months in advance. If you don't receive a letter, please call our office to schedule the follow-up appointment.

## 2015-03-12 NOTE — Progress Notes (Signed)
Cardiology Office Note   Date:  03/12/2015   ID:  Sonia Robertson, DOB December 31, 1955, MRN [id number]  PCP:  Mathews Argyle, MD  Cardiologist:   Sinclair Grooms, MD   No chief complaint on file.     History of Present Illness: Sonia Robertson is a 59 y.o. female who presents for CAD. She continues to smoke. She is relatively inactive due to lumbar disc disease. She has not had angina. She denies dyspnea. No claudication.    Past Medical History  Diagnosis Date  . Fibroid   . Myocardial infarction     1988  . Angina     NONE SINCE MARCH   SEES DR Linard Millers    . Shortness of breath     WITH EXERTION  . Hypertension   . Thyroid disease   . Hypothyroidism     Past Surgical History  Procedure Laterality Date  . Abdominal hysterectomy  2004    TAH BSO  . Oophorectomy      BSO  . Knee surgery      Arthroscopic  . Cardiac catheterization    . Back surgery       Current Outpatient Prescriptions  Medication Sig Dispense Refill  . amLODipine (NORVASC) 10 MG tablet Take 10 mg by mouth daily.      Marland Kitchen aspirin 325 MG tablet Take 325 mg by mouth daily.      Marland Kitchen atenolol (TENORMIN) 25 MG tablet Take 25 mg by mouth daily.      Marland Kitchen atorvastatin (LIPITOR) 40 MG tablet Take 20 mg by mouth daily.     . Calcium Carbonate-Vit D-Min (CALCIUM 1200 PO) Take 1 tablet by mouth daily.    . Coenzyme Q10 (COQ10) 200 MG CAPS Take 1 capsule by mouth daily.    Marland Kitchen estradiol (CLIMARA) 0.05 mg/24hr patch Place 1 patch (0.05 mg total) onto the skin once a week. 4 patch 12  . ibuprofen (ADVIL,MOTRIN) 200 MG tablet Take 800 mg by mouth every 8 (eight) hours as needed. Pain/ swelling.  Only if she has not taken Diclofenac.    . isosorbide mononitrate (IMDUR) 60 MG 24 hr tablet TAKE 1 BY MOUTH DAILY 90 tablet 0  . levothyroxine (SYNTHROID, LEVOTHROID) 125 MCG tablet Take 125 mcg by mouth daily before breakfast.    . mirabegron ER (MYRBETRIQ) 50 MG TB24 tablet Take 1 tablet (50 mg total) by mouth daily. 30  tablet 11  . Multiple Vitamin (MULTIVITAMIN WITH MINERALS) TABS Take 1 tablet by mouth daily.    . nitroGLYCERIN (NITROSTAT) 0.4 MG SL tablet Place 1 tablet (0.4 mg total) under the tongue every 5 (five) minutes as needed for chest pain. 25 tablet 3  . Vitamin D, Cholecalciferol, 1000 UNITS TABS Take 1 tablet by mouth daily.     No current facility-administered medications for this visit.    Allergies:   Morphine and related    Social History:  The patient  reports that she has been smoking Cigarettes.  She has been smoking about 1.00 pack per day. She does not have any smokeless tobacco history on file. She reports that she does not drink alcohol or use illicit drugs.   Family History:  The patient's family history includes Hypertension in her mother. She was adopted.    ROS:  Please see the history of present illness.   Otherwise, review of systems are positive for cough, snoring, difficulty with balance, low back and leg discomfort. .   All  other systems are reviewed and negative.    PHYSICAL EXAM: VS:  BP 106/82 mmHg  Pulse 66  Ht 5' 6.5" (1.689 m)  Wt 176 lb 12.8 oz (80.196 kg)  BMI 28.11 kg/m2 , BMI Body mass index is 28.11 kg/(m^2). GEN: Well nourished, well developed, in no acute distress HEENT: normal Neck: no JVD, carotid bruits, or masses Cardiac: RRR; no murmurs, rubs, or gallops,no edema  Respiratory:  clear to auscultation bilaterally, normal work of breathing GI: soft, nontender, nondistended, + BS MS: no deformity or atrophy Skin: warm and dry, no rash Neuro:  Strength and sensation are intact Psych: euthymic mood, full affect   EKG:  EKG is ordered today. The ekg ordered today demonstrates normal sinus rhythm with diffuse nonspecific ST-T wave abnormality   Recent Labs: 03/05/2015: ALT 23    Lipid Panel    Component Value Date/Time   CHOL 165 03/10/2015 1718   TRIG 68.0 03/10/2015 1718   HDL 45.10 03/10/2015 1718   CHOLHDL 4 03/10/2015 1718   VLDL  13.6 03/10/2015 1718   LDLCALC 106* 03/10/2015 1718   LDLDIRECT 68.6 06/16/2014 0830      Wt Readings from Last 3 Encounters:  03/12/15 176 lb 12.8 oz (80.196 kg)  12/14/14 175 lb (79.379 kg)  03/10/14 173 lb 1.9 oz (78.527 kg)      Other studies Reviewed: Additional studies/ records that were reviewed today include: . Review of the above records demnone   ASSESSMENT AND PLAN:  Angina pectoris due to  Chronic CAD with stable/absent angina   Essential hypertension: Controlled   Smoking: Counseled to stop   Hyperlipidemia with labs at target     Current medicines are reviewed at length with the patient today.  The patient does not have concerns regarding medicines.  The following changes have been made:  no change  Labs/ tests ordered today include:  No orders of the defined types were placed in this encounter.     Disposition:   FU with Linard Millers  in 1 year    Signed, Sinclair Grooms, MD  03/12/2015 3:27 PM    [facility] [place], [place], [place]  [address] Phone: [phone]; Fax: [phone]

## 2015-04-05 ENCOUNTER — Other Ambulatory Visit: Payer: Self-pay | Admitting: Interventional Cardiology

## 2015-07-13 ENCOUNTER — Other Ambulatory Visit: Payer: Self-pay | Admitting: *Deleted

## 2015-07-13 MED ORDER — NITROGLYCERIN 0.4 MG SL SUBL: 0.4000 mg | SUBLINGUAL_TABLET | SUBLINGUAL | Status: DC | PRN

## 2015-10-12 ENCOUNTER — Other Ambulatory Visit: Payer: Self-pay | Admitting: Interventional Cardiology

## 2015-10-22 ENCOUNTER — Other Ambulatory Visit: Payer: Self-pay

## 2015-10-22 DIAGNOSIS — Z1231 Encounter for screening mammogram for malignant neoplasm of breast: Secondary | ICD-10-CM

## 2015-11-25 ENCOUNTER — Ambulatory Visit: Payer: BLUE CROSS/BLUE SHIELD

## 2015-12-16 ENCOUNTER — Encounter: Payer: Self-pay | Admitting: Gynecology

## 2015-12-16 ENCOUNTER — Ambulatory Visit (INDEPENDENT_AMBULATORY_CARE_PROVIDER_SITE_OTHER): Payer: BLUE CROSS/BLUE SHIELD | Admitting: Gynecology

## 2015-12-16 VITALS — BP 130/72 | Ht 66.75 in | Wt 172.0 lb

## 2015-12-16 DIAGNOSIS — Z79890 Hormone replacement therapy (postmenopausal): Secondary | ICD-10-CM | POA: Diagnosis not present

## 2015-12-16 DIAGNOSIS — Z01419 Encounter for gynecological examination (general) (routine) without abnormal findings: Secondary | ICD-10-CM | POA: Diagnosis not present

## 2015-12-16 DIAGNOSIS — Z720 Tobacco use: Secondary | ICD-10-CM

## 2015-12-16 DIAGNOSIS — F17200 Nicotine dependence, unspecified, uncomplicated: Secondary | ICD-10-CM

## 2015-12-16 DIAGNOSIS — Z780 Asymptomatic menopausal state: Secondary | ICD-10-CM

## 2015-12-16 DIAGNOSIS — N3281 Overactive bladder: Secondary | ICD-10-CM

## 2015-12-16 MED ORDER — VARENICLINE TARTRATE 0.5 MG PO TABS: ORAL_TABLET | ORAL | Status: DC

## 2015-12-16 MED ORDER — MIRABEGRON ER 50 MG PO TB24: 50.0000 mg | ORAL_TABLET | Freq: Every day | ORAL | Status: DC

## 2015-12-16 MED ORDER — ESTRADIOL 0.05 MG/24HR TD PTWK: 0.0500 mg | MEDICATED_PATCH | TRANSDERMAL | Status: DC

## 2015-12-16 NOTE — Progress Notes (Signed)
Sonia Robertson 1956/09/21 [id number]   History:    60 y.o.  for annual gyn exam with no complaints today. Review of her record indicated she continues to smoke. She states she smokes a pack cigarette per day and has been smoking for approximately 50 years. Her PCP has not ordered a chest x-ray on her in quite some time. Patient states that in 2011 she had a colonoscopy and benign polyps were removed. Patient with no previous history of any abnormal Pap smears reported. Patient had a normal bone density study in 2015. She's currently on Climara once a week transdermal patches 0.05 mg. Patient with past history of total abdominal hysterectomy with bilateral subbing oophorectomy for symptomatic uterine fibroids. Her PCP is Dr. Felipa Eth who is been doing her blood work and she states that all her vaccines are up-to-date. She is currently on Myrbetriq for her overactive bladder and has done well. She is taking her calcium and vitamin D for osteoporosis prevention.  Past medical history,surgical history, family history and social history were all reviewed and documented in the EPIC chart.  Gynecologic History No LMP recorded. Patient has had a hysterectomy. Contraception: status post hysterectomy Last Pap: 2012. Results were: normal Last mammogram: 2015. Results were: normal  Obstetric History OB History  Gravida Para Term Preterm AB SAB TAB Ectopic Multiple Living  3 2 2  1     2     # Outcome Date GA Lbr Len/2nd Weight Sex Delivery Anes PTL Lv  3 AB           2 Term           1 Term                ROS: A ROS was performed and pertinent positives and negatives are included in the history.  GENERAL: No fevers or chills. HEENT: No change in vision, no earache, sore throat or sinus congestion. NECK: No pain or stiffness. CARDIOVASCULAR: No chest pain or pressure. No palpitations. PULMONARY: No shortness of breath, cough or wheeze. GASTROINTESTINAL: No abdominal pain, nausea, vomiting or  diarrhea, melena or bright red blood per rectum. GENITOURINARY: No urinary frequency, urgency, hesitancy or dysuria. MUSCULOSKELETAL: No joint or muscle pain, no back pain, no recent trauma. DERMATOLOGIC: No rash, no itching, no lesions. ENDOCRINE: No polyuria, polydipsia, no heat or cold intolerance. No recent change in weight. HEMATOLOGICAL: No anemia or easy bruising or bleeding. NEUROLOGIC: No headache, seizures, numbness, tingling or weakness. PSYCHIATRIC: No depression, no loss of interest in normal activity or change in sleep pattern.     Exam: chaperone present  BP 130/72 mmHg  Ht 5' 6.75" (1.695 m)  Wt 172 lb (78.019 kg)  BMI 27.16 kg/m2  Body mass index is 27.16 kg/(m^2).  General appearance : Well developed well nourished female. No acute distress HEENT: Eyes: no retinal hemorrhage or exudates,  Neck supple, trachea midline, no carotid bruits, no thyroidmegaly Lungs: Clear to auscultation, no rhonchi or wheezes, or rib retractions  Heart: Regular rate and rhythm, no murmurs or gallops Breast:Examined in sitting and supine position were symmetrical in appearance, no palpable masses or tenderness,  no skin retraction, no nipple inversion, no nipple discharge, no skin discoloration, no axillary or supraclavicular lymphadenopathy Abdomen: no palpable masses or tenderness, no rebound or guarding Extremities: no edema or skin discoloration or tenderness  Pelvic:  Bartholin, Urethra, Skene Glands: Within normal limits             Vagina:  No gross lesions or discharge  Cervix: Absent  Uterus  absent  Adnexa  Without masses or tenderness  Anus and perineum  normal   Rectovaginal  normal sphincter tone without palpated masses or tenderness             Hemoccult cards will be provided     Assessment/Plan:  60 y.o. female for annual exam who was counseled once again the detrimental effects of smoking. She is going to be started on Chantix. Literature information was provided. Risk  benefits and pros and cons were discussed. I'm going to obtain a chest x-ray PA and lateral for screening for lung cancer which she goes schedule her mammogram which is scheduled for tomorrow. Her PCP has been doing her blood work. She will schedule a bone density study for the month of February here in the office. She was given literature information on anti-smoking techniques. We discussed importance of calcium vitamin D and weightbearing exercises for osteoporosis prevention.   Terrance Mass MD, 4:14 PM 12/16/2015

## 2015-12-16 NOTE — Patient Instructions (Signed)
Varenicline oral tablets What is this medicine? VARENICLINE (var EN i kleen) is used to help people quit smoking. It can reduce the symptoms caused by stopping smoking. It is used with a patient support program recommended by your physician. This medicine may be used for other purposes; ask your health care provider or pharmacist if you have questions. What should I tell my health care provider before I take this medicine? They need to know if you have any of these conditions: -bipolar disorder, depression, schizophrenia or other mental illness -heart disease -if you often drink alcohol -kidney disease -peripheral vascular disease -seizures -stroke -suicidal thoughts, plans, or attempt; a previous suicide attempt by you or a family member -an unusual or allergic reaction to varenicline, other medicines, foods, dyes, or preservatives -pregnant or trying to get pregnant -breast-feeding How should I use this medicine? Take this medicine by mouth after eating. Take with a full glass of water. Follow the directions on the prescription label. Take your doses at regular intervals. Do not take your medicine more often than directed. There are 3 ways you can use this medicine to help you quit smoking; talk to your health care professional to decide which plan is right for you: 1) you can choose a quit date and start this medicine 1 week before the quit date, or, 2) you can start taking this medicine before you choose a quit date, and then pick a quit date between day 8 and 35 days of treatment, or, 3) if you are not sure that you are able or willing to quit smoking right away, start taking this medicine and slowly decrease the amount you smoke as directed by your health care professional with the goal of being cigarette-free by week 12 of treatment. Stick to your plan; ask about support groups or other ways to help you remain cigarette-free. If you are motivated to quit smoking and did not succeed  during a previous attempt with this medicine for reasons other than side effects, or if you returned to smoking after this treatment, speak with your health care professional about whether another course of this medicine may be right for you. A special MedGuide will be given to you by the pharmacist with each prescription and refill. Be sure to read this information carefully each time. Talk to your pediatrician regarding the use of this medicine in children. This medicine is not approved for use in children. Overdosage: If you think you have taken too much of this medicine contact a poison control center or emergency room at once. NOTE: This medicine is only for you. Do not share this medicine with others. What if I miss a dose? If you miss a dose, take it as soon as you can. If it is almost time for your next dose, take only that dose. Do not take double or extra doses. What may interact with this medicine? -alcohol or any product that contains alcohol -insulin -other stop smoking aids -theophylline -warfarin This list may not describe all possible interactions. Give your health care provider a list of all the medicines, herbs, non-prescription drugs, or dietary supplements you use. Also tell them if you smoke, drink alcohol, or use illegal drugs. Some items may interact with your medicine. What should I watch for while using this medicine? Visit your doctor or health care professional for regular check ups. Ask for ongoing advice and encouragement from your doctor or healthcare professional, friends, and family to help you quit. If you smoke while on  this medication, quit again Your mouth may get dry. Chewing sugarless gum or sucking hard candy, and drinking plenty of water may help. Contact your doctor if the problem does not go away or is severe. You may get drowsy or dizzy. Do not drive, use machinery, or do anything that needs mental alertness until you know how this medicine affects you. Do  not stand or sit up quickly, especially if you are an older patient. This reduces the risk of dizzy or fainting spells. Sleepwalking can happen during treatment with this medicine, and can sometimes lead to behavior that is harmful to you, other people, or property. Stop taking this medicine and tell your doctor if you start sleepwalking or have other unusual sleep-related activity. Decrease the amount of alcoholic beverages that you drink during treatment with this medicine until you know if this medicine affects your ability to tolerate alcohol. Some people have experienced increased drunkenness (intoxication), unusual or sometimes aggressive behavior, or no memory of things that have happened (amnesia) during treatment with this medicine. The use of this medicine may increase the chance of suicidal thoughts or actions. Pay special attention to how you are responding while on this medicine. Any worsening of mood, or thoughts of suicide or dying should be reported to your health care professional right away. What side effects may I notice from receiving this medicine? Side effects that you should report to your doctor or health care professional as soon as possible: -allergic reactions like skin rash, itching or hives, swelling of the face, lips, tongue, or throat -acting aggressive, being angry or violent, or acting on dangerous impulses -breathing problems -changes in vision -chest pain or chest tightness -confusion, trouble speaking or understanding -new or worsening depression, anxiety, or panic attacks -extreme increase in activity and talking (mania) -fast, irregular heartbeat -feeling faint or lightheaded, falls -fever -pain in legs when walking -problems with balance, talking, walking -redness, blistering, peeling or loosening of the skin, including inside the mouth -ringing in ears -seeing or hearing things that aren't there (hallucinations) -seizures -sleepwalking -sudden numbness  or weakness of the face, arm or leg -thoughts about suicide or dying, or attempts to commit suicide -trouble passing urine or change in the amount of urine -unusual bleeding or bruising -unusually weak or tired Side effects that usually do not require medical attention (report to your doctor or health care professional if they continue or are bothersome): -constipation -headache -nausea, vomiting -strange dreams -stomach gas -trouble sleeping This list may not describe all possible side effects. Call your doctor for medical advice about side effects. You may report side effects to FDA at 1-800-FDA-1088. Where should I keep my medicine? Keep out of the reach of children. Store at room temperature between 15 and 30 degrees C (59 and 86 degrees F). Throw away any unused medicine after the expiration date. NOTE: This sheet is a summary. It may not cover all possible information. If you have questions about this medicine, talk to your doctor, pharmacist, or health care provider.    2016, Elsevier/Gold Standard. (2015-08-05 16:14:23) Smoking Cessation, Tips for Success If you are ready to quit smoking, congratulations! You have chosen to help yourself be healthier. Cigarettes bring nicotine, tar, carbon monoxide, and other irritants into your body. Your lungs, heart, and blood vessels will be able to work better without these poisons. There are many different ways to quit smoking. Nicotine gum, nicotine patches, a nicotine inhaler, or nicotine nasal spray can help with physical craving. Hypnosis, support  groups, and medicines help break the habit of smoking. WHAT THINGS CAN I DO TO MAKE QUITTING EASIER?  Here are some tips to help you quit for good:  Pick a date when you will quit smoking completely. Tell all of your friends and family about your plan to quit on that date.  Do not try to slowly cut down on the number of cigarettes you are smoking. Pick a quit date and quit smoking completely  starting on that day.  Throw away all cigarettes.   Clean and remove all ashtrays from your home, work, and car.  On a card, write down your reasons for quitting. Carry the card with you and read it when you get the urge to smoke.  Cleanse your body of nicotine. Drink enough water and fluids to keep your urine clear or pale yellow. Do this after quitting to flush the nicotine from your body.  Learn to predict your moods. Do not let a bad situation be your excuse to have a cigarette. Some situations in your life might tempt you into wanting a cigarette.  Never have "just one" cigarette. It leads to wanting another and another. Remind yourself of your decision to quit.  Change habits associated with smoking. If you smoked while driving or when feeling stressed, try other activities to replace smoking. Stand up when drinking your coffee. Brush your teeth after eating. Sit in a different chair when you read the paper. Avoid alcohol while trying to quit, and try to drink fewer caffeinated beverages. Alcohol and caffeine may urge you to smoke.  Avoid foods and drinks that can trigger a desire to smoke, such as sugary or spicy foods and alcohol.  Ask people who smoke not to smoke around you.  Have something planned to do right after eating or having a cup of coffee. For example, plan to take a walk or exercise.  Try a relaxation exercise to calm you down and decrease your stress. Remember, you may be tense and nervous for the first 2 weeks after you quit, but this will pass.  Find new activities to keep your hands busy. Play with a pen, coin, or rubber band. Doodle or draw things on paper.  Brush your teeth right after eating. This will help cut down on the craving for the taste of tobacco after meals. You can also try mouthwash.   Use oral substitutes in place of cigarettes. Try using lemon drops, carrots, cinnamon sticks, or chewing gum. Keep them handy so they are available when you have  the urge to smoke.  When you have the urge to smoke, try deep breathing.  Designate your home as a nonsmoking area.  If you are a heavy smoker, ask your health care provider about a prescription for nicotine chewing gum. It can ease your withdrawal from nicotine.  Reward yourself. Set aside the cigarette money you save and buy yourself something nice.  Look for support from others. Join a support group or smoking cessation program. Ask someone at home or at work to help you with your plan to quit smoking.  Always ask yourself, "Do I need this cigarette or is this just a reflex?" Tell yourself, "Today, I choose not to smoke," or "I do not want to smoke." You are reminding yourself of your decision to quit.  Do not replace cigarette smoking with electronic cigarettes (commonly called e-cigarettes). The safety of e-cigarettes is unknown, and some may contain harmful chemicals.  If you relapse, do not give  up! Plan ahead and think about what you will do the next time you get the urge to smoke. HOW WILL I FEEL WHEN I QUIT SMOKING? You may have symptoms of withdrawal because your body is used to nicotine (the addictive substance in cigarettes). You may crave cigarettes, be irritable, feel very hungry, cough often, get headaches, or have difficulty concentrating. The withdrawal symptoms are only temporary. They are strongest when you first quit but will go away within 10-14 days. When withdrawal symptoms occur, stay in control. Think about your reasons for quitting. Remind yourself that these are signs that your body is healing and getting used to being without cigarettes. Remember that withdrawal symptoms are easier to treat than the major diseases that smoking can cause.  Even after the withdrawal is over, expect periodic urges to smoke. However, these cravings are generally short lived and will go away whether you smoke or not. Do not smoke! WHAT RESOURCES ARE AVAILABLE TO HELP ME QUIT SMOKING? Your  health care provider can direct you to community resources or hospitals for support, which may include:  Group support.  Education.  Hypnosis.  Therapy.   This information is not intended to replace advice given to you by your health care provider. Make sure you discuss any questions you have with your health care provider.   Document Released: 08/18/2004 Document Revised: 12/11/2014 Document Reviewed: 05/08/2013 Elsevier Interactive Patient Education [employer].

## 2015-12-17 ENCOUNTER — Other Ambulatory Visit: Payer: Self-pay | Admitting: Gynecology

## 2015-12-17 ENCOUNTER — Other Ambulatory Visit: Payer: Self-pay | Admitting: Women's Health

## 2015-12-17 ENCOUNTER — Telehealth: Payer: Self-pay | Admitting: *Deleted

## 2015-12-17 ENCOUNTER — Ambulatory Visit
Admission: RE | Admit: 2015-12-17 | Discharge: 2015-12-17 | Disposition: A | Discharge status: Home / Self Care | Payer: BLUE CROSS/BLUE SHIELD | Source: Ambulatory Visit

## 2015-12-17 ENCOUNTER — Ambulatory Visit
Admission: RE | Admit: 2015-12-17 | Discharge: 2015-12-17 | Disposition: A | Discharge status: Home / Self Care | Payer: BLUE CROSS/BLUE SHIELD | Source: Ambulatory Visit | Attending: Gynecology | Admitting: Gynecology

## 2015-12-17 DIAGNOSIS — Z1231 Encounter for screening mammogram for malignant neoplasm of breast: Secondary | ICD-10-CM

## 2015-12-17 DIAGNOSIS — F17200 Nicotine dependence, unspecified, uncomplicated: Secondary | ICD-10-CM

## 2015-12-17 NOTE — Telephone Encounter (Signed)
PA done online for [facility] ER 50 mg tablet, will wait for response.

## 2015-12-21 NOTE — Telephone Encounter (Signed)
Pt medication for Myrbetriq 50 mg tablets was not approved by her insurance, pt will need alternate medication such as Oxybutynin, Oxybutynin ER, Darifenacin, Tolerodine, Tolerodine ER, Trospium, Trospium ER. Please advise

## 2015-12-21 NOTE — Telephone Encounter (Signed)
Detrol 2 mg by mouth daily. By the way extended release XL does exist

## 2015-12-21 NOTE — Telephone Encounter (Signed)
Cory from [place] called tried and failed medications pt has taken, I informed him she has tried [employer] only.

## 2015-12-21 NOTE — Telephone Encounter (Signed)
Detrol only comes in 1mg , 2mg , 4mg  tablets, please advise

## 2015-12-21 NOTE — Telephone Encounter (Signed)
Call in prescription for Detrol plan XL 5 mg 1 by mouth daily #30  11 refills

## 2015-12-22 MED ORDER — TOLTERODINE TARTRATE ER 2 MG PO CP24: 2.0000 mg | ORAL_CAPSULE | Freq: Every day | ORAL | Status: DC

## 2015-12-22 NOTE — Telephone Encounter (Signed)
Left on voicemail Rx has been sent. 

## 2016-01-11 ENCOUNTER — Other Ambulatory Visit: Payer: Self-pay | Admitting: Gynecology

## 2016-02-07 ENCOUNTER — Other Ambulatory Visit: Payer: Self-pay | Admitting: Interventional Cardiology

## 2016-02-07 MED ORDER — NITROGLYCERIN 0.4 MG SL SUBL: 0.4000 mg | SUBLINGUAL_TABLET | SUBLINGUAL | Status: DC | PRN

## 2016-02-07 NOTE — Telephone Encounter (Signed)
Sonia Crome, MD at 03/12/2015 3:26 PM  nitroGLYCERIN (NITROSTAT) 0.4 MG SL tabletPlace 1 tablet (0.4 mg total) under the tongue every 5 (five) minutes as needed for chest pain. Current medicines are reviewed at length with the patient today. The patient does not have concerns regarding medicines.  The following changes have been made: no change

## 2016-02-14 ENCOUNTER — Ambulatory Visit (INDEPENDENT_AMBULATORY_CARE_PROVIDER_SITE_OTHER): Payer: BLUE CROSS/BLUE SHIELD

## 2016-02-14 ENCOUNTER — Other Ambulatory Visit: Payer: Self-pay | Admitting: Gynecology

## 2016-02-14 DIAGNOSIS — F17200 Nicotine dependence, unspecified, uncomplicated: Secondary | ICD-10-CM

## 2016-02-14 DIAGNOSIS — Z13820 Encounter for screening for osteoporosis: Secondary | ICD-10-CM | POA: Diagnosis not present

## 2016-02-14 DIAGNOSIS — Z780 Asymptomatic menopausal state: Secondary | ICD-10-CM

## 2016-02-14 DIAGNOSIS — Z720 Tobacco use: Secondary | ICD-10-CM | POA: Diagnosis not present

## 2016-04-03 ENCOUNTER — Other Ambulatory Visit: Payer: Self-pay | Admitting: Interventional Cardiology

## 2016-05-04 ENCOUNTER — Ambulatory Visit (INDEPENDENT_AMBULATORY_CARE_PROVIDER_SITE_OTHER): Payer: BLUE CROSS/BLUE SHIELD | Admitting: Interventional Cardiology

## 2016-05-04 ENCOUNTER — Encounter: Payer: Self-pay | Admitting: Interventional Cardiology

## 2016-05-04 VITALS — BP 122/80 | HR 49 | Ht 67.0 in | Wt 176.8 lb

## 2016-05-04 DIAGNOSIS — E079 Disorder of thyroid, unspecified: Secondary | ICD-10-CM

## 2016-05-04 DIAGNOSIS — Z720 Tobacco use: Secondary | ICD-10-CM | POA: Diagnosis not present

## 2016-05-04 DIAGNOSIS — I209 Angina pectoris, unspecified: Secondary | ICD-10-CM

## 2016-05-04 DIAGNOSIS — I10 Essential (primary) hypertension: Secondary | ICD-10-CM | POA: Diagnosis not present

## 2016-05-04 DIAGNOSIS — E785 Hyperlipidemia, unspecified: Secondary | ICD-10-CM

## 2016-05-04 DIAGNOSIS — F17200 Nicotine dependence, unspecified, uncomplicated: Secondary | ICD-10-CM

## 2016-05-04 MED ORDER — ISOSORBIDE MONONITRATE ER 60 MG PO TB24: 60.0000 mg | ORAL_TABLET | Freq: Every day | ORAL | Status: DC

## 2016-05-04 NOTE — Progress Notes (Signed)
Cardiology Office Note    Date:  05/04/2016   ID:  Sonia Robertson, DOB 11/11/1956, MRN [id number]  PCP:  Mathews Argyle, MD  Cardiologist: Sinclair Grooms, MD   Chief Complaint  Patient presents with  . Coronary Artery Disease    History of Present Illness:  Sonia Robertson is a 60 y.o. female f/u CAD, Hyperlipidemia, and hypertension.  [name] is doing well. She has rare episodes of chest discomfort that occur at random. She has not had syncope, orthopnea, PND, or lower extremity swelling. No medication side effects.  Past Medical History  Diagnosis Date  . Fibroid   . Myocardial infarction ([place])     1988  . Angina     NONE SINCE MARCH   SEES DR Linard Millers    . Shortness of breath     WITH EXERTION  . Hypertension   . Thyroid disease   . Hypothyroidism     Past Surgical History  Procedure Laterality Date  . Abdominal hysterectomy  2004    TAH BSO  . Oophorectomy      BSO  . Knee surgery      Arthroscopic  . Cardiac catheterization    . Back surgery      Current Medications: Outpatient Prescriptions Prior to Visit  Medication Sig Dispense Refill  . amLODipine (NORVASC) 10 MG tablet Take 10 mg by mouth daily.      Marland Kitchen aspirin 325 MG tablet Take 325 mg by mouth daily.      Marland Kitchen atenolol (TENORMIN) 25 MG tablet Take 25 mg by mouth daily.      Marland Kitchen atorvastatin (LIPITOR) 40 MG tablet Take 20 mg by mouth daily.     . Calcium Carbonate-Vit D-Min (CALCIUM 1200 PO) Take 1 tablet by mouth daily.    . Coenzyme Q10 (COQ10) 200 MG CAPS Take 1 capsule by mouth daily.    Marland Kitchen estradiol (CLIMARA) 0.05 mg/24hr patch Place 1 patch (0.05 mg total) onto the skin once a week. 4 patch 12  . ibuprofen (ADVIL,MOTRIN) 200 MG tablet Take 800 mg by mouth every 8 (eight) hours as needed. Pain/ swelling.  Only if she has not taken Diclofenac.    . levothyroxine (SYNTHROID, LEVOTHROID) 125 MCG tablet Take 125 mcg by mouth daily before breakfast.    . Multiple Vitamin (MULTIVITAMIN WITH MINERALS)  TABS Take 1 tablet by mouth daily.    . nitroGLYCERIN (NITROSTAT) 0.4 MG SL tablet Place 1 tablet (0.4 mg total) under the tongue every 5 (five) minutes as needed for chest pain. 25 tablet 3  . tolterodine (DETROL LA) 2 MG 24 hr capsule Take 1 capsule (2 mg total) by mouth daily. 30 capsule 11  . varenicline (CHANTIX) 0.5 MG tablet Chantix 0.5 mg PO qd x 3 days, then 0.5 mg PO BID x 4 days then 1 mg BID 60 tablet 2  . Vitamin D, Cholecalciferol, 1000 UNITS TABS Take 1 tablet by mouth daily.    . isosorbide mononitrate (IMDUR) 60 MG 24 hr tablet Take 1 tablet (60 mg total) by mouth daily. Please call and schedule a one year follow up appointment 90 tablet 0  . estradiol (CLIMARA - DOSED IN MG/24 HR) 0.05 mg/24hr patch PLACE 1 PATCH ONTO THE SKIN ONCE A WEEK 12 patch 4  . mirabegron ER (MYRBETRIQ) 50 MG TB24 tablet Take 1 tablet (50 mg total) by mouth daily. 30 tablet 11   No facility-administered medications prior to visit.  Allergies:   Morphine and related   Social History   Social History  . Marital Status: Married    Spouse Name: N/A  . Number of Children: N/A  . Years of Education: N/A   Social History Main Topics  . Smoking status: Current Every Day Smoker -- 1.00 packs/day    Types: Cigarettes  . Smokeless tobacco: Never Used  . Alcohol Use: No  . Drug Use: No  . Sexual Activity: Yes    Birth Control/ Protection: Surgical     Comment: 1ST INTERCOURSE- 13, PARTNERS- GREATER THAN 5   Other Topics Concern  . None   Social History Narrative     Family History:  The patient's family history includes Hypertension in her mother. She was adopted.   ROS:   Please see the history of present illness.    Limited in physical activity by back discomfort, cramping, dizziness, occasional diarrhea and cough. She does snore. Has constipation and difficulty with balance.All other systems reviewed and are negative.   PHYSICAL EXAM:   VS:  BP 122/80 mmHg  Pulse 49  Ht 5\' 7"  (1.702  m)  Wt 176 lb 12.8 oz (80.196 kg)  BMI 27.68 kg/m2   GEN: Well nourished, well developed, in no acute distress HEENT: normal Neck: no JVD, carotid bruits, or masses Cardiac: RRR; no murmurs, rubs, or gallops,no edema  Respiratory:  clear to auscultation bilaterally, normal work of breathing GI: soft, nontender, nondistended, + BS MS: no deformity or atrophy Skin: warm and dry, no rash Neuro:  Alert and Oriented x 3, Strength and sensation are intact Psych: euthymic mood, full affect  Wt Readings from Last 3 Encounters:  05/04/16 176 lb 12.8 oz (80.196 kg)  12/16/15 172 lb (78.019 kg)  03/12/15 176 lb 12.8 oz (80.196 kg)      Studies/Labs Reviewed:   EKG:  EKG  Marked sinus bradycardia at 49 bpm. Nonspecific ST flattening. No acute ST-T wave change.   Current Labs: No results found for requested labs within last 365 days.   Lipid Panel    Component Value Date/Time   CHOL 165 03/10/2015 1718   TRIG 68.0 03/10/2015 1718   HDL 45.10 03/10/2015 1718   CHOLHDL 4 03/10/2015 1718   VLDL 13.6 03/10/2015 1718   LDLCALC 106* 03/10/2015 1718   LDLDIRECT 68.6 06/16/2014 0830    Additional studies/ records that were reviewed today include:  No new data    ASSESSMENT:    1. Angina pectoris ([place])   2. Essential hypertension   3. Smoking   4. Thyroid disease      PLAN:  In order of problems listed above:  1. With her coronary disease, she is functioning well. She has minimal episodes of angina pectoris. We recommended aerobic activity  2.  Low-salt diet is recommended  3.  Encouraged smoking cessation 4. Followed by primary care 5. Lipids are managed by Dr. Felipa Eth.   Medication Adjustments/Labs and Tests Ordered: Current medicines are reviewed at length with the patient today.  Concerns regarding medicines are outlined above.  Medication changes, Labs and Tests ordered today are listed in the Patient Instructions below. Patient Instructions  Medication  Instructions:  Your physician recommends that you continue on your current medications as directed. Please refer to the Current Medication list given to you today.   Labwork: None ordered  Testing/Procedures: Non ordered  Follow-Up: Your physician wants you to follow-up in: 1 year with Dr.Saray Capasso You will receive a reminder letter in the  mail two months in advance. If you don't receive a letter, please call our office to schedule the follow-up appointment.   Any Other Special Instructions Will Be Listed Below (If Applicable).     If you need a refill on your cardiac medications before your next appointment, please call your pharmacy.       Signed, Sinclair Grooms, MD  05/04/2016 1:38 PM    [facility] [address], [place], [place]  [address] Phone: [phone]; Fax: [phone]

## 2016-05-04 NOTE — Patient Instructions (Signed)
Medication Instructions:  Your physician recommends that you continue on your current medications as directed. Please refer to the Current Medication list given to you today.   Labwork: None ordered  Testing/Procedures: Non ordered  Follow-Up: Your physician wants you to follow-up in: 1 year with Dr.Smith You will receive a reminder letter in the mail two months in advance. If you don't receive a letter, please call our office to schedule the follow-up appointment.   Any Other Special Instructions Will Be Listed Below (If Applicable).     If you need a refill on your cardiac medications before your next appointment, please call your pharmacy.

## 2016-05-10 ENCOUNTER — Other Ambulatory Visit: Payer: Self-pay | Admitting: Geriatric Medicine

## 2016-05-10 DIAGNOSIS — F17200 Nicotine dependence, unspecified, uncomplicated: Secondary | ICD-10-CM

## 2016-05-18 ENCOUNTER — Telehealth: Payer: Self-pay | Admitting: Interventional Cardiology

## 2016-05-18 ENCOUNTER — Ambulatory Visit
Admission: RE | Admit: 2016-05-18 | Discharge: 2016-05-18 | Disposition: A | Discharge status: Home / Self Care | Payer: BLUE CROSS/BLUE SHIELD | Source: Ambulatory Visit | Attending: Geriatric Medicine | Admitting: Geriatric Medicine

## 2016-05-18 ENCOUNTER — Other Ambulatory Visit: Payer: Self-pay | Admitting: Geriatric Medicine

## 2016-05-18 DIAGNOSIS — M542 Cervicalgia: Secondary | ICD-10-CM

## 2016-05-18 DIAGNOSIS — F17200 Nicotine dependence, unspecified, uncomplicated: Secondary | ICD-10-CM

## 2016-05-18 NOTE — Telephone Encounter (Signed)
New message     The pt was looking up her EKG there was a mark on sinus bradycardia, abnormal ECG this was on "My Chart" the pt wants to know what that means cause the MD did not say anything to her when she was here for a appointment.

## 2016-05-18 NOTE — Telephone Encounter (Signed)
Returned pt call. Adv her that there was nothing of concern on her recent EKG. It read abnormal because her heartrate was 49bpm. Which is ok since she is on a beta blocker and is asymptomatic. Pt voiced appreciation for the call and verbalized understanding.

## 2016-06-17 ENCOUNTER — Other Ambulatory Visit: Payer: Self-pay | Admitting: Gynecology

## 2016-08-10 ENCOUNTER — Other Ambulatory Visit: Payer: Self-pay | Admitting: Gastroenterology

## 2016-08-11 ENCOUNTER — Encounter (HOSPITAL_COMMUNITY): Payer: Self-pay | Admitting: *Deleted

## 2016-08-14 ENCOUNTER — Ambulatory Visit (HOSPITAL_COMMUNITY): Payer: BLUE CROSS/BLUE SHIELD | Admitting: Anesthesiology

## 2016-08-14 ENCOUNTER — Encounter (HOSPITAL_COMMUNITY)
Admission: RE | Disposition: A | Discharge status: Home / Self Care | Payer: Self-pay | Source: Ambulatory Visit | Attending: Gastroenterology

## 2016-08-14 ENCOUNTER — Encounter (HOSPITAL_COMMUNITY): Payer: Self-pay | Admitting: Anesthesiology

## 2016-08-14 ENCOUNTER — Ambulatory Visit (HOSPITAL_COMMUNITY)
Admission: RE | Admit: 2016-08-14 | Discharge: 2016-08-14 | Disposition: A | Discharge status: Home / Self Care | Payer: BLUE CROSS/BLUE SHIELD | Source: Ambulatory Visit | Attending: Gastroenterology | Admitting: Gastroenterology

## 2016-08-14 DIAGNOSIS — Z7982 Long term (current) use of aspirin: Secondary | ICD-10-CM | POA: Diagnosis not present

## 2016-08-14 DIAGNOSIS — E039 Hypothyroidism, unspecified: Secondary | ICD-10-CM | POA: Insufficient documentation

## 2016-08-14 DIAGNOSIS — G4733 Obstructive sleep apnea (adult) (pediatric): Secondary | ICD-10-CM | POA: Insufficient documentation

## 2016-08-14 DIAGNOSIS — E7800 Pure hypercholesterolemia, unspecified: Secondary | ICD-10-CM | POA: Insufficient documentation

## 2016-08-14 DIAGNOSIS — I10 Essential (primary) hypertension: Secondary | ICD-10-CM | POA: Diagnosis not present

## 2016-08-14 DIAGNOSIS — Z1211 Encounter for screening for malignant neoplasm of colon: Secondary | ICD-10-CM | POA: Diagnosis not present

## 2016-08-14 DIAGNOSIS — I252 Old myocardial infarction: Secondary | ICD-10-CM | POA: Insufficient documentation

## 2016-08-14 DIAGNOSIS — Z79899 Other long term (current) drug therapy: Secondary | ICD-10-CM | POA: Insufficient documentation

## 2016-08-14 DIAGNOSIS — Z86010 Personal history of colonic polyps: Secondary | ICD-10-CM | POA: Insufficient documentation

## 2016-08-14 DIAGNOSIS — K621 Rectal polyp: Secondary | ICD-10-CM | POA: Insufficient documentation

## 2016-08-14 DIAGNOSIS — M1990 Unspecified osteoarthritis, unspecified site: Secondary | ICD-10-CM | POA: Insufficient documentation

## 2016-08-14 DIAGNOSIS — Z791 Long term (current) use of non-steroidal anti-inflammatories (NSAID): Secondary | ICD-10-CM | POA: Diagnosis not present

## 2016-08-14 DIAGNOSIS — I2510 Atherosclerotic heart disease of native coronary artery without angina pectoris: Secondary | ICD-10-CM | POA: Diagnosis not present

## 2016-08-14 DIAGNOSIS — F17210 Nicotine dependence, cigarettes, uncomplicated: Secondary | ICD-10-CM | POA: Insufficient documentation

## 2016-08-14 HISTORY — DX: Unspecified osteoarthritis, unspecified site: M19.90

## 2016-08-14 HISTORY — DX: Other specified health status: Z78.9

## 2016-08-14 HISTORY — PX: COLONOSCOPY WITH PROPOFOL: SHX5780

## 2016-08-14 HISTORY — DX: Encounter for adoption services: Z02.82

## 2016-08-14 SURGERY — COLONOSCOPY WITH PROPOFOL
Anesthesia: Monitor Anesthesia Care

## 2016-08-14 MED ORDER — LACTATED RINGERS IV SOLN
INTRAVENOUS | Status: DC
  Administered 2016-08-14: 1000 mL via INTRAVENOUS

## 2016-08-14 MED ORDER — PROPOFOL 10 MG/ML IV BOLUS
INTRAVENOUS | Status: AC
Start: 2016-08-14 — End: 2016-08-14
  Filled 2016-08-14: qty 40

## 2016-08-14 MED ORDER — PROPOFOL 10 MG/ML IV BOLUS
INTRAVENOUS | Status: DC | PRN
  Administered 2016-08-14: 20 mg via INTRAVENOUS
  Administered 2016-08-14: 30 mg via INTRAVENOUS
  Administered 2016-08-14 (×2): 20 mg via INTRAVENOUS
  Administered 2016-08-14: 30 mg via INTRAVENOUS
  Administered 2016-08-14 (×4): 20 mg via INTRAVENOUS
  Administered 2016-08-14: 10 mg via INTRAVENOUS
  Administered 2016-08-14: 30 mg via INTRAVENOUS
  Administered 2016-08-14 (×2): 20 mg via INTRAVENOUS

## 2016-08-14 SURGICAL SUPPLY — 21 items

## 2016-08-14 NOTE — Discharge Instructions (Signed)

## 2016-08-14 NOTE — H&P (Signed)
  Procedure: Surveillance colonoscopy. 2006 and 2011 colonoscopies were performed with removal of small tubular adenomatous colon polyps  History: The patient is a 60 year old female born Jan 19, 1956. She is scheduled to undergo a surveillance colonoscopy today.  Past medical history: Coronary artery disease. Anterior myocardial infarction diagnosed in 1988. Hypertension. Hypercholesterolemia. Hyperactive urinary bladder. Obstructive sleep apnea syndrome. Hypothyroidism. Gene 2017 pulmonary function testing showed mild restriction. Chronic cigarette smoking. Total abdominal hysterectomy with bilateral salpingo-oophorectomy. Three cardiac catheterizations performed.  Medication allergies: Morphine causes severe nausea  Exam: The patient is alert and lying comfortably on the endoscopy stretcher. Abdomen soft and nontender to palpation. Lungs are clear to auscultation. Cardiac exam reveals a regular rhythm.  Plan: Proceed with surveillance colonoscopy

## 2016-08-14 NOTE — Anesthesia Postprocedure Evaluation (Signed)
Anesthesia Post Note  Patient: Sonia Robertson  Procedure(s) Performed: Procedure(s) (LRB): COLONOSCOPY WITH PROPOFOL (N/A)  Patient location during evaluation: PACU Anesthesia Type: MAC Level of consciousness: awake and alert and oriented Pain management: pain level controlled Vital Signs Assessment: post-procedure vital signs reviewed and stable Respiratory status: spontaneous breathing, nonlabored ventilation and respiratory function stable Cardiovascular status: stable and blood pressure returned to baseline Postop Assessment: no signs of nausea or vomiting Anesthetic complications: no    Last Vitals:  Vitals:   08/14/16 0928  BP: 127/77  Pulse: 65  Resp: 13  Temp: 36.5 C    Last Pain:  Vitals:   08/14/16 0928  TempSrc: Oral                 Emmerie Battaglia A.

## 2016-08-14 NOTE — Op Note (Signed)
[facility] Patient Name: Sonia Robertson Procedure Date: 08/14/2016 MRN: [id number] Attending MD: Garlan Fair , MD Date of Birth: 02-Jun-1956 CSN: [id number] Age: 60 Admit Type: Outpatient Procedure:                Colonoscopy Indications:              High risk colon cancer surveillance: Personal                            history of adenoma less than 10 mm in size Providers:                Garlan Fair, MD, Cleda Daub, RN, [facility], Technician, [place] CRNA, CRNA Referring MD:              Medicines:                Propofol per Anesthesia Complications:            No immediate complications. Estimated Blood Loss:     Estimated blood loss: none. Procedure:                Pre-Anesthesia Assessment:                           - Prior to the procedure, a History and Physical                            was performed, and patient medications and                            allergies were reviewed. The patient's tolerance of                            previous anesthesia was also reviewed. The risks                            and benefits of the procedure and the sedation                            options and risks were discussed with the patient.                            All questions were answered, and informed consent                            was obtained. Prior Anticoagulants: The patient has                            taken aspirin, last dose was 1 day prior to                            procedure. ASA Grade Assessment: II - A patient  with mild systemic disease. After reviewing the                            risks and benefits, the patient was deemed in                            satisfactory condition to undergo the procedure.                           After obtaining informed consent, the colonoscope                            was passed under direct vision. Throughout the                   procedure, the patient's blood pressure, pulse, and                            oxygen saturations were monitored continuously. The                            [id number] scope was introduced through                            the anus and advanced to the the cecum, identified                            by appendiceal orifice and ileocecal valve. The                            colonoscopy was performed without difficulty. The                            patient tolerated the procedure well. The quality                            of the bowel preparation was good. The terminal                            ileum, the ileocecal valve, the appendiceal orifice                            and the rectum were photographed. Findings:      The perianal and digital rectal examinations were normal.      Three sessile hyperplastic appearing polyps were found in the proximal       rectum. The polyps were 4 mm in size. These polyps were removed with a       cold snare. Resection and retrieval were complete.      The exam was otherwise without abnormality. Impression:               - Three 4 mm polyps in the rectum, removed with a                            cold snare. Resected and  retrieved.                           - The examination was otherwise normal. Moderate Sedation:      N/A- Per Anesthesia Care Recommendation:           - Patient has a contact number available for                            emergencies. The signs and symptoms of potential                            delayed complications were discussed with the                            patient. Return to normal activities tomorrow.                            Written discharge instructions were provided to the                            patient.                           - Repeat colonoscopy in 5 years for surveillance.                           - Resume previous diet.                           - Continue present  medications. Procedure Code(s):        --- Professional ---                           [phone], Colonoscopy, flexible; with removal of                            tumor(s), polyp(s), or other lesion(s) by snare                            technique Diagnosis Code(s):        --- Professional ---                           Z86.010, Personal history of colonic polyps                           K62.1, Rectal polyp CPT copyright 2016 American Medical Association. All rights reserved. The codes documented in this report are preliminary and upon coder review may  be revised to meet current compliance requirements. Earle Gell, MD Garlan Fair, MD 08/14/2016 10:16:41 AM This report has been signed electronically. Number of Addenda: 0

## 2016-08-14 NOTE — Anesthesia Preprocedure Evaluation (Addendum)
Anesthesia Evaluation  Patient identified by MRN, date of birth, ID band Patient awake    Reviewed: Allergy & Precautions, NPO status , Patient's Chart, lab work & pertinent test results, reviewed documented beta blocker date and time   Airway Mallampati: II  TM Distance: >3 FB Neck ROM: Full    Dental no notable dental hx. (+) Teeth Intact   Pulmonary shortness of breath and with exertion, Current Smoker,    Pulmonary exam normal breath sounds clear to auscultation       Cardiovascular hypertension, Pt. on medications and Pt. on home beta blockers + angina with exertion + Past MI  Normal cardiovascular exam Rhythm:Regular Rate:Normal     Neuro/Psych  Neuromuscular disease    GI/Hepatic Neg liver ROS,   Endo/Other  Hypothyroidism Hyperlipidemia  Renal/GU negative Renal ROS Bladder dysfunction  OAB    Musculoskeletal  (+) Arthritis ,   Abdominal   Peds  Hematology   Anesthesia Other Findings   Reproductive/Obstetrics                             Anesthesia Physical Anesthesia Plan  ASA: III  Anesthesia Plan: MAC   Post-op Pain Management:    Induction: Intravenous  Airway Management Planned: Natural Airway, Nasal Cannula and Simple Face Mask  Additional Equipment:   Intra-op Plan:   Post-operative Plan:   Informed Consent: I have reviewed the patients History and Physical, chart, labs and discussed the procedure including the risks, benefits and alternatives for the proposed anesthesia with the patient or authorized representative who has indicated his/her understanding and acceptance.     Plan Discussed with: CRNA, Anesthesiologist and Surgeon  Anesthesia Plan Comments:         Anesthesia Quick Evaluation

## 2016-08-14 NOTE — Transfer of Care (Signed)
Immediate Anesthesia Transfer of Care Note  Patient: Sonia Robertson  Procedure(s) Performed: Procedure(s): COLONOSCOPY WITH PROPOFOL (N/A)  Patient Location: PACU  Anesthesia Type:MAC  Level of Consciousness: sedated  Airway & Oxygen Therapy: Patient Spontanous Breathing and Patient connected to nasal cannula oxygen  Post-op Assessment: Report given to RN and Post -op Vital signs reviewed and stable  Post vital signs: Reviewed and stable  Last Vitals:  Vitals:   08/14/16 0928  BP: 127/77  Pulse: 65  Resp: 13  Temp: 36.5 C    Last Pain:  Vitals:   08/14/16 0928  TempSrc: Oral         Complications: No apparent anesthesia complications

## 2016-08-15 ENCOUNTER — Encounter (HOSPITAL_COMMUNITY): Payer: Self-pay | Admitting: Gastroenterology

## 2016-12-18 ENCOUNTER — Encounter: Payer: BLUE CROSS/BLUE SHIELD | Admitting: Gynecology

## 2016-12-27 ENCOUNTER — Other Ambulatory Visit: Payer: Self-pay | Admitting: Gynecology

## 2016-12-27 DIAGNOSIS — Z1231 Encounter for screening mammogram for malignant neoplasm of breast: Secondary | ICD-10-CM

## 2016-12-28 ENCOUNTER — Encounter: Payer: BLUE CROSS/BLUE SHIELD | Admitting: Gynecology

## 2017-01-03 ENCOUNTER — Encounter: Payer: Self-pay | Admitting: Gynecology

## 2017-01-03 ENCOUNTER — Ambulatory Visit (INDEPENDENT_AMBULATORY_CARE_PROVIDER_SITE_OTHER): Payer: 59 | Admitting: Gynecology

## 2017-01-03 VITALS — BP 134/80 | Ht 67.0 in | Wt 178.0 lb

## 2017-01-03 DIAGNOSIS — F17200 Nicotine dependence, unspecified, uncomplicated: Secondary | ICD-10-CM | POA: Diagnosis not present

## 2017-01-03 DIAGNOSIS — Z780 Asymptomatic menopausal state: Secondary | ICD-10-CM

## 2017-01-03 DIAGNOSIS — Z79890 Hormone replacement therapy (postmenopausal): Secondary | ICD-10-CM

## 2017-01-03 DIAGNOSIS — Z01419 Encounter for gynecological examination (general) (routine) without abnormal findings: Secondary | ICD-10-CM | POA: Diagnosis not present

## 2017-01-03 DIAGNOSIS — N3281 Overactive bladder: Secondary | ICD-10-CM

## 2017-01-03 NOTE — Patient Instructions (Signed)
Steps to Quit Smoking Smoking tobacco can be harmful to your health and can affect almost every organ in your body. Smoking puts you, and those around you, at risk for developing many serious chronic diseases. Quitting smoking is difficult, but it is one of the best things that you can do for your health. It is never too late to quit. What are the benefits of quitting smoking? When you quit smoking, you lower your risk of developing serious diseases and conditions, such as:  Lung cancer or lung disease, such as COPD.  Heart disease.  Stroke.  Heart attack.  Infertility.  Osteoporosis and bone fractures.  Additionally, symptoms such as coughing, wheezing, and shortness of breath may get better when you quit. You may also find that you get sick less often because your body is stronger at fighting off colds and infections. If you are pregnant, quitting smoking can help to reduce your chances of having a baby of low birth weight. How do I get ready to quit? When you decide to quit smoking, create a plan to make sure that you are successful. Before you quit:  Pick a date to quit. Set a date within the next two weeks to give you time to prepare.  Write down the reasons why you are quitting. Keep this list in places where you will see it often, such as on your bathroom mirror or in your car or wallet.  Identify the people, places, things, and activities that make you want to smoke (triggers) and avoid them. Make sure to take these actions: ? Throw away all cigarettes at home, at work, and in your car. ? Throw away smoking accessories, such as ashtrays and lighters. ? Clean your car and make sure to empty the ashtray. ? Clean your home, including curtains and carpets.  Tell your family, friends, and coworkers that you are quitting. Support from your loved ones can make quitting easier.  Talk with your health care provider about your options for quitting smoking.  Find out what treatment  options are covered by your health insurance.  What strategies can I use to quit smoking? Talk with your healthcare provider about different strategies to quit smoking. Some strategies include:  Quitting smoking altogether instead of gradually lessening how much you smoke over a period of time. Research shows that quitting "cold turkey" is more successful than gradually quitting.  Attending in-person counseling to help you build problem-solving skills. You are more likely to have success in quitting if you attend several counseling sessions. Even short sessions of 10 minutes can be effective.  Finding resources and support systems that can help you to quit smoking and remain smoke-free after you quit. These resources are most helpful when you use them often. They can include: ? Online chats with a counselor. ? Telephone quitlines. ? Printed self-help materials. ? Support groups or group counseling. ? Text messaging programs. ? Mobile phone applications.  Taking medicines to help you quit smoking. (If you are pregnant or breastfeeding, talk with your health care provider first.) Some medicines contain nicotine and some do not. Both types of medicines help with cravings, but the medicines that include nicotine help to relieve withdrawal symptoms. Your health care provider may recommend: ? Nicotine patches, gum, or lozenges. ? Nicotine inhalers or sprays. ? Non-nicotine medicine that is taken by mouth.  Talk with your health care provider about combining strategies, such as taking medicines while you are also receiving in-person counseling. Using these two strategies together   makes you more likely to succeed in quitting than if you used either strategy on its own. If you are pregnant or breastfeeding, talk with your health care provider about finding counseling or other support strategies to quit smoking. Do not take medicine to help you quit smoking unless told to do so by your health care  provider. What things can I do to make it easier to quit? Quitting smoking might feel overwhelming at first, but there is a lot that you can do to make it easier. Take these important actions:  Reach out to your family and friends and ask that they support and encourage you during this time. Call telephone quitlines, reach out to support groups, or work with a counselor for support.  Ask people who smoke to avoid smoking around you.  Avoid places that trigger you to smoke, such as bars, parties, or smoke-break areas at work.  Spend time around people who do not smoke.  Lessen stress in your life, because stress can be a smoking trigger for some people. To lessen stress, try: ? Exercising regularly. ? Deep-breathing exercises. ? Yoga. ? Meditating. ? Performing a body scan. This involves closing your eyes, scanning your body from head to toe, and noticing which parts of your body are particularly tense. Purposefully relax the muscles in those areas.  Download or purchase mobile phone or tablet apps (applications) that can help you stick to your quit plan by providing reminders, tips, and encouragement. There are many free apps, such as QuitGuide from the CDC (Centers for Disease Control and Prevention). You can find other support for quitting smoking (smoking cessation) through smokefree.gov and other websites.  How will I feel when I quit smoking? Within the first 24 hours of quitting smoking, you may start to feel some withdrawal symptoms. These symptoms are usually most noticeable 2-3 days after quitting, but they usually do not last beyond 2-3 weeks. Changes or symptoms that you might experience include:  Mood swings.  Restlessness, anxiety, or irritation.  Difficulty concentrating.  Dizziness.  Strong cravings for sugary foods in addition to nicotine.  Mild weight gain.  Constipation.  Nausea.  Coughing or a sore throat.  Changes in how your medicines work in your  body.  A depressed mood.  Difficulty sleeping (insomnia).  After the first 2-3 weeks of quitting, you may start to notice more positive results, such as:  Improved sense of smell and taste.  Decreased coughing and sore throat.  Slower heart rate.  Lower blood pressure.  Clearer skin.  The ability to breathe more easily.  Fewer sick days.  Quitting smoking is very challenging for most people. Do not get discouraged if you are not successful the first time. Some people need to make many attempts to quit before they achieve long-term success. Do your best to stick to your quit plan, and talk with your health care provider if you have any questions or concerns. This information is not intended to replace advice given to you by your health care provider. Make sure you discuss any questions you have with your health care provider. Document Released: 11/14/2001 Document Revised: 07/18/2016 Document Reviewed: 04/06/2015 Elsevier Interactive Patient Education  2017 [employer]  

## 2017-01-03 NOTE — Progress Notes (Signed)
Sonia Robertson 08/20/1956 [id number]   History:    61 y.o.  for annual gyn exam who is asymptomatic today. Patient continues to smoke half a pack cigarette per day. She did have a normal chest x-ray in 2017. Patient had a colonoscopy in 2017 benign polyps removed she is on a 5 year recall. Patient with no previous history of any abnormal Pap smears in the past prior to her hysterectomy or after. Her bone density study was normal in 2017.She's currently on Climara once a week transdermal patches 0.05 mg. Patient with past history of total abdominal hysterectomy with bilateral salpingo-oophorectomy secondary to symptomatic fibroids.Her PCP is Dr. Felipa Eth who is been doing her blood work and she states that all her vaccines are up-to-date. With Detrol LA 2 mg daily for overactive bladder. She is taking her calcium vitamin D for osteoporosis prevention. Patient last year was reminded to see the dermatologist because of 2 raised hyperpigmented area 1 underneath her breasts and the second one on her anterior chest wall looks like senile keratosis but there are hyperpigmented dark areas as well. She has not seen the dermatologist she is afraid to go.  Past medical history,surgical history, family history and social history were all reviewed and documented in the EPIC chart.  Gynecologic History No LMP recorded. Patient has had a hysterectomy. Contraception: status post hysterectomy Last Pap: 2012. Results were: normal Last mammogram: 2017. Results were: normal  Obstetric History OB History  Gravida Para Term Preterm AB Living  3 2 2   1 2   SAB TAB Ectopic Multiple Live Births               # Outcome Date GA Lbr Len/2nd Weight Sex Delivery Anes PTL Lv  3 AB           2 Term           1 Term                ROS: A ROS was performed and pertinent positives and negatives are included in the history.  GENERAL: No fevers or chills. HEENT: No change in vision, no earache, sore throat or sinus  congestion. NECK: No pain or stiffness. CARDIOVASCULAR: No chest pain or pressure. No palpitations. PULMONARY: No shortness of breath, cough or wheeze. GASTROINTESTINAL: No abdominal pain, nausea, vomiting or diarrhea, melena or bright red blood per rectum. GENITOURINARY: No urinary frequency, urgency, hesitancy or dysuria. MUSCULOSKELETAL: No joint or muscle pain, no back pain, no recent trauma. DERMATOLOGIC: No rash, no itching, no lesions. ENDOCRINE: No polyuria, polydipsia, no heat or cold intolerance. No recent change in weight. HEMATOLOGICAL: No anemia or easy bruising or bleeding. NEUROLOGIC: No headache, seizures, numbness, tingling or weakness. PSYCHIATRIC: No depression, no loss of interest in normal activity or change in sleep pattern.     Exam: chaperone present  BP 134/80   Ht 5\' 7"  (1.702 m)   Wt 178 lb (80.7 kg)   BMI 27.88 kg/m   Body mass index is 27.88 kg/m.  General appearance : Well developed well nourished female. No acute distress HEENT: Eyes: no retinal hemorrhage or exudates,  Neck supple, trachea midline, no carotid bruits, no thyroidmegaly Lungs: Clear to auscultation, no rhonchi or wheezes, or rib retractions  Heart: Regular rate and rhythm, no murmurs or gallops Breast:Examined in sitting and supine position were symmetrical in appearance, no palpable masses or tenderness,  no skin retraction, no nipple inversion, no nipple discharge, no skin discoloration, no  axillary or supraclavicular lymphadenopathy Abdomen: no palpable masses or tenderness, no rebound or guarding Extremities: no edema or skin discoloration or tenderness  Pelvic:  Bartholin, Urethra, Skene Glands: Within normal limits             Vagina: No gross lesions or discharge  Cervix: Absent  Uterus  absent  Adnexa  Without masses or tenderness  Anus and perineum  normal   Rectovaginal  normal sphincter tone without palpated masses or tenderness             Hemoccult colonoscopy less than 12  months ago with polyps removed.     Assessment/Plan:  61 y.o. female for annual exam who is menopausal who we have been tapering her estrogen requirement for vasomotor symptoms due to the fact the she continues to smoke half a pack cigarette per day. We discussed the potential risk such as DVT and pulmonary embolism as well as breast cancer. She is using the transdermal patch 0.05 mg once a week I've asked her to cut it in half and then within 6-12 months all point come off of it completely. Her Pap smear no longer indicated. She scheduled for mammogram next week. We discussed importance of calcium vitamin D and weightbearing exercises for osteoporosis prevention. She was once again reminded to see the dermatologist for a biopsy of these 2 anterior chest wall hyperpigmented lesions which are very suspicious.   Terrance Mass MD, 4:03 PM 01/03/2017

## 2017-01-22 ENCOUNTER — Ambulatory Visit
Admission: RE | Admit: 2017-01-22 | Discharge: 2017-01-22 | Disposition: A | Discharge status: Home / Self Care | Payer: 59 | Source: Ambulatory Visit | Attending: Gynecology | Admitting: Gynecology

## 2017-01-22 DIAGNOSIS — Z1231 Encounter for screening mammogram for malignant neoplasm of breast: Secondary | ICD-10-CM | POA: Diagnosis not present

## 2017-02-14 ENCOUNTER — Telehealth: Payer: Self-pay

## 2017-02-14 MED ORDER — ESTRADIOL 0.05 MG/24HR TD PTWK: 0.0500 mg | MEDICATED_PATCH | TRANSDERMAL | 3 refills | Status: DC

## 2017-02-14 MED ORDER — TOLTERODINE TARTRATE ER 4 MG PO CP24: 4.0000 mg | ORAL_CAPSULE | Freq: Every day | ORAL | 0 refills | Status: DC

## 2017-02-14 NOTE — Telephone Encounter (Signed)
Please call in prescription for Detrol LA 4 mg 1 by mouth daily #30 with 11 refills

## 2017-02-14 NOTE — Telephone Encounter (Signed)
Rx sent 

## 2017-02-14 NOTE — Telephone Encounter (Signed)
Patient called for refill on Detrol.  She asked if you could increase if because she feels like it is not working as well as originally.

## 2017-02-19 ENCOUNTER — Telehealth: Payer: Self-pay | Admitting: Interventional Cardiology

## 2017-02-19 MED ORDER — ISOSORBIDE MONONITRATE ER 60 MG PO TB24: 60.0000 mg | ORAL_TABLET | Freq: Every day | ORAL | 3 refills | Status: DC

## 2017-02-19 NOTE — Telephone Encounter (Signed)
New Message  Pt call requesting to speak with RN about medication and her insurance. Please call back to discuss

## 2017-02-19 NOTE — Telephone Encounter (Signed)
Pt wanted 90 day prescription sent into [facility] on [address].  Advised pt I would send that in now. Pt appreciative for assistance.

## 2017-02-21 ENCOUNTER — Telehealth: Payer: Self-pay

## 2017-02-21 MED ORDER — TOLTERODINE TARTRATE ER 4 MG PO CP24: 4.0000 mg | ORAL_CAPSULE | Freq: Every day | ORAL | 3 refills | Status: DC

## 2017-02-21 MED ORDER — ESTRADIOL 0.05 MG/24HR TD PTWK: 0.0500 mg | MEDICATED_PATCH | TRANSDERMAL | 3 refills | Status: DC

## 2017-02-21 NOTE — Telephone Encounter (Signed)
Patient called stating her insurance has changed and therefore her pharmacy has changed. She asked me to resend her generic Detrol LA and her generic Climara patches to her new pharmacy. She needs 90 days supply for ins co. Rx's sent.

## 2017-04-09 ENCOUNTER — Telehealth: Payer: Self-pay | Admitting: Interventional Cardiology

## 2017-04-09 DIAGNOSIS — I10 Essential (primary) hypertension: Secondary | ICD-10-CM

## 2017-04-09 DIAGNOSIS — R2243 Localized swelling, mass and lump, lower limb, bilateral: Secondary | ICD-10-CM

## 2017-04-09 NOTE — Telephone Encounter (Signed)
Pt states for the last 3 weeks she has noticed swelling in bil ankles, left worse than right.  Also having numbness on the bottom of both feet.  When pt wears socks she notices an indention when she takes them off.  Denies SOB, dizziness or lightheadedness.  No weight gain and has actually decreased the amount of sodium in her diet.  States when she lays down at night her legs tingle and burn.  Pt has occasional "twinge" of pain in chest that lasts 1-2 secs.  States she has had a lot of problems with heartburn recently.  Pt due for f/u with Dr. Tamala Julian.  Scheduled pt for 04/16/17.  Advised I would send message to Dr. Tamala Julian for further review and advisement.

## 2017-04-09 NOTE — Telephone Encounter (Signed)
New Message;     Pt ankles are swelling,tingling and numbness in the bottom of her feet and arm sometimes.Please call to advise.

## 2017-04-12 ENCOUNTER — Encounter: Payer: Self-pay | Admitting: Interventional Cardiology

## 2017-04-12 NOTE — Telephone Encounter (Signed)
Spoke with pt and advised her of recommendations for labs.  Pt verbalized understanding and was in agreement with this plan.

## 2017-04-12 NOTE — Telephone Encounter (Signed)
When she comes for office visit we need to order a CBC, TSH, basic metabolic panel, and BNP.

## 2017-04-15 NOTE — Progress Notes (Signed)
Cardiology Office Note    Date:  04/16/2017   ID:  Sonia Robertson, DOB 11/04/1956, MRN [id number]  PCP:  Lajean Manes, MD  Cardiologist: Sinclair Grooms, MD   Chief Complaint  Patient presents with  . Coronary Artery Disease  . Leg Swelling    History of Present Illness:  Sonia Robertson is a 62 y.o. female female f/u CAD wiith history of PCI on diagonal 1987, Hyperlipidemia, and hypertension.  Shalaunda became concerned because she had left lower extremity/foot with edema greater than right, and also began noticing tingling in the bottom of her feet. No dyspnea, exertional chest pain, palpitations, or orthopnea were noted. No exertional limitations. Over the past week the left lower extremity edema has resolved. The tingling and numbness on the bottom of her feet has persisted.  She has occasional son of a ventricular chest tightness that can occur at random, and usually at rest. Last less than 10 minutes and is not accentuated with physical activity.  Last cardiac catheterization 10 years ago: CONCLUSIONS:  1. There is severe diffuse vasoconstriction throughout the entire left      coronary system, left anterior descending more involved than      circumflex.  The mid left anterior descending contains an eccentric      50-70% stenosis after intracoronary nitroglycerin.  Prior to the      intracoronary nitroglycerin, the stenosis was greater than 80%. 60% first diagonal.  2. Minimal luminal irregularities noted in the circumflex and right      coronary.  3. Normal left ventricular function.    Past Medical History:  Diagnosis Date  . Adopted    pt is adopted  . Angina    NONE SINCE MARCH   SEES DR H Tyquasia Pant  -LOV 0'99 Epic.-, 08-11-16"remains with sporadic episodes of chest pain relived with NTG use_ Dr. Tamala Julian aware.  . Arthritis    "thinks arthritis in neck"  . Fibroid   . Hypertension   . Hypothyroidism   . Myocardial infarction ([place])    1988  . Shortness of breath    WITH EXERTION  . Thyroid disease     Past Surgical History:  Procedure Laterality Date  . ABDOMINAL HYSTERECTOMY  2004   TAH BSO  . BACK SURGERY     fusion lumbar  . CARDIAC CATHETERIZATION    . COLONOSCOPY WITH PROPOFOL N/A 08/14/2016   Procedure: COLONOSCOPY WITH PROPOFOL;  Surgeon: Garlan Fair, MD;  Location: [facility];  Service: Endoscopy;  Laterality: N/A;  . KNEE SURGERY     Arthroscopic  . OOPHORECTOMY     BSO    Current Medications: Outpatient Medications Prior to Visit  Medication Sig Dispense Refill  . amLODipine (NORVASC) 10 MG tablet Take 10 mg by mouth daily.      Marland Kitchen atenolol (TENORMIN) 25 MG tablet Take 25 mg by mouth daily. Takes 1/2 tablet(12.5 mg) daily AM/ 1/2 tablet (12.5 mg) daily PM = 25 mg daily total    . atorvastatin (LIPITOR) 40 MG tablet Take 20 mg by mouth daily. Takes 1/2 tablet(20 mg dosage) daily bedtime    . Calcium Carbonate-Vit D-Min (CALCIUM 1200 PO) Take 1 tablet by mouth daily.    . CHANTIX 0.5 MG tablet TAKE 1 BY MOUTH DAILY FOR 3 DAYS, THEN 1 BY MOUTH 2 TIMES DAILY FOR 4 DAYS, THEN 2 BY MOUTH 2 TIMES DAILY THEREAFTER 343 tablet 0  . Coenzyme Q10 (COQ10) 200 MG CAPS Take 1 capsule  by mouth at bedtime.     Marland Kitchen estradiol (CLIMARA) 0.05 mg/24hr patch Place 1 patch (0.05 mg total) onto the skin once a week. 12 patch 3  . ibuprofen (ADVIL,MOTRIN) 200 MG tablet Take 800 mg by mouth every 8 (eight) hours as needed. Pain/ swelling.  Only if she has not taken Diclofenac.    . isosorbide mononitrate (IMDUR) 60 MG 24 hr tablet Take 1 tablet (60 mg total) by mouth daily. 90 tablet 3  . levothyroxine (SYNTHROID, LEVOTHROID) 125 MCG tablet Take 125 mcg by mouth daily before breakfast.    . Multiple Vitamin (MULTIVITAMIN WITH MINERALS) TABS Take 1 tablet by mouth daily.    . nitroGLYCERIN (NITROSTAT) 0.4 MG SL tablet Place 1 tablet (0.4 mg total) under the tongue every 5 (five) minutes as needed for chest pain. 25 tablet 3  . Vitamin D, Cholecalciferol,  1000 UNITS TABS Take 1 tablet by mouth at bedtime.     Marland Kitchen aspirin 325 MG tablet Take 325 mg by mouth at bedtime.     . tolterodine (DETROL LA) 4 MG 24 hr capsule Take 1 capsule (4 mg total) by mouth daily. (Patient not taking: Reported on 04/16/2017) 90 capsule 3   No facility-administered medications prior to visit.      Allergies:   Morphine and related   Social History   Social History  . Marital status: Married    Spouse name: N/A  . Number of children: N/A  . Years of education: N/A   Social History Main Topics  . Smoking status: Current Every Day Smoker    Packs/day: 0.50    Types: Cigarettes  . Smokeless tobacco: Never Used     Comment: Chantix is helping decrease use  . Alcohol use No  . Drug use: No  . Sexual activity: Yes    Birth control/ protection: Surgical     Comment: 1ST INTERCOURSE- 13, PARTNERS- GREATER THAN 5   Other Topics Concern  . None   Social History Narrative  . None     Family History:  The patient's family history includes Hypertension in her mother. She was adopted.   ROS:   Please see the history of present illness.    Lumbar disc disease. Chronic back discomfort. Prior surgery. Leg pain intermittently. Leg discomfort is not exertional. Easy bruising. Chronic shortness of breath with activity. Continues to smoke up to a half pack of cigarettes per day.  All other systems reviewed and are negative.   PHYSICAL EXAM:   VS:  BP (!) 116/94 (BP Location: Right Arm)   Pulse (!) 59   Ht 5\' 7"  (1.702 m)   Wt 177 lb (80.3 kg)   BMI 27.72 kg/m    GEN: Well nourished, well developed, in no acute distress  HEENT: normal  Neck: no JVD, carotid bruits, or masses Cardiac: RRR; no murmurs, rubs, or gallops,no edema. There are spider veins and varicosities noted in the posterior left and right cath.  Respiratory:  clear to auscultation bilaterally, normal work of breathing GI: soft, nontender, nondistended, + BS MS: no deformity or atrophy  Skin:  warm and dry, no rash Neuro:  Alert and Oriented x 3, Strength and sensation are intact Psych: euthymic mood, full affect  Wt Readings from Last 3 Encounters:  04/16/17 177 lb (80.3 kg)  01/03/17 178 lb (80.7 kg)  08/14/16 176 lb (79.8 kg)      Studies/Labs Reviewed:   EKG:  EKG  Normal sinus rhythm, nonspecific T wave  abnormality. Poor wave progression V1 and V2. No change compared to prior.  Recent Labs: No results found for requested labs within last 8760 hours.   Lipid Panel    Component Value Date/Time   CHOL 165 03/10/2015 1718   TRIG 68.0 03/10/2015 1718   HDL 45.10 03/10/2015 1718   CHOLHDL 4 03/10/2015 1718   VLDL 13.6 03/10/2015 1718   LDLCALC 106 (H) 03/10/2015 1718   LDLDIRECT 68.6 06/16/2014 0830    Additional studies/ records that were reviewed today include:  None    ASSESSMENT:    1. Essential hypertension   2. Other hyperlipidemia   3. Ankle edema, bilateral   4. Numbness and tingling of foot      PLAN:  In order of problems listed above:  1. The blood pressure is under excellent control. 2. Lipids will be monitored today. LDL target is less than 70. 3. Ankle edema is related to venous insufficiency. There is no evidence for DVT. Pedal pulses are 2+ and bounding and therefore I do not believe there is any concern for PAD. 4. Numbness and tingling in her feet high believe are related to lumbar disc disease.  TSH, hemoglobin, metabolic profile will be obtained. Clinical follow-up in one year. I encouraged aerobic activity. I encouraged tobacco cessation.    Medication Adjustments/Labs and Tests Ordered: Current medicines are reviewed at length with the patient today.  Concerns regarding medicines are outlined above.  Medication changes, Labs and Tests ordered today are listed in the Patient Instructions below. Patient Instructions  Medication Instructions:  Decrease aspirin to 81 mg daily.  Labwork: Today--BMET/Pro  BNP/TSH/CBCd  Testing/Procedures: None  Follow-Up: Your physician wants you to follow-up in: 1 year with Dr Tamala Julian. (May 2019).  You will receive a reminder letter in the mail two months in advance. If you don't receive a letter, please call our office to schedule the follow-up appointment.        If you need a refill on your cardiac medications before your next appointment, please call your pharmacy.      Signed, Sinclair Grooms, MD  04/16/2017 12:36 PM    [facility] [place], [place], [place]  [address] Phone: [phone]; Fax: [phone]

## 2017-04-16 ENCOUNTER — Encounter: Payer: Self-pay | Admitting: Interventional Cardiology

## 2017-04-16 ENCOUNTER — Ambulatory Visit (INDEPENDENT_AMBULATORY_CARE_PROVIDER_SITE_OTHER): Payer: 59 | Admitting: Interventional Cardiology

## 2017-04-16 ENCOUNTER — Other Ambulatory Visit: Payer: 59 | Admitting: *Deleted

## 2017-04-16 VITALS — BP 116/94 | HR 59 | Ht 67.0 in | Wt 177.0 lb

## 2017-04-16 DIAGNOSIS — M25471 Effusion, right ankle: Secondary | ICD-10-CM | POA: Diagnosis not present

## 2017-04-16 DIAGNOSIS — R202 Paresthesia of skin: Secondary | ICD-10-CM | POA: Diagnosis not present

## 2017-04-16 DIAGNOSIS — M25472 Effusion, left ankle: Secondary | ICD-10-CM

## 2017-04-16 DIAGNOSIS — I25119 Atherosclerotic heart disease of native coronary artery with unspecified angina pectoris: Secondary | ICD-10-CM

## 2017-04-16 DIAGNOSIS — F17200 Nicotine dependence, unspecified, uncomplicated: Secondary | ICD-10-CM | POA: Diagnosis not present

## 2017-04-16 DIAGNOSIS — I10 Essential (primary) hypertension: Secondary | ICD-10-CM

## 2017-04-16 DIAGNOSIS — R2243 Localized swelling, mass and lump, lower limb, bilateral: Secondary | ICD-10-CM

## 2017-04-16 DIAGNOSIS — R200 Anesthesia of skin: Secondary | ICD-10-CM | POA: Diagnosis not present

## 2017-04-16 DIAGNOSIS — E7849 Other hyperlipidemia: Secondary | ICD-10-CM

## 2017-04-16 DIAGNOSIS — E784 Other hyperlipidemia: Secondary | ICD-10-CM | POA: Diagnosis not present

## 2017-04-16 NOTE — Patient Instructions (Addendum)
Medication Instructions:  Decrease aspirin to 81 mg daily.  Labwork: Today--BMET/Pro BNP/TSH/CBCd  Testing/Procedures: None  Follow-Up: Your physician wants you to follow-up in: 1 year with Dr Tamala Julian. (May 2019).  You will receive a reminder letter in the mail two months in advance. If you don't receive a letter, please call our office to schedule the follow-up appointment.        If you need a refill on your cardiac medications before your next appointment, please call your pharmacy.

## 2017-04-17 LAB — BASIC METABOLIC PANEL
BUN/Creatinine Ratio: 20 (ref 12–28)
BUN: 15 mg/dL (ref 8–27)
CO2: 25 mmol/L (ref 18–29)
Calcium: 9.4 mg/dL (ref 8.7–10.3)
Chloride: 107 mmol/L — ABNORMAL HIGH (ref 96–106)
Creatinine, Ser: 0.74 mg/dL (ref 0.57–1.00)
GFR calc Af Amer: 100 mL/min/{1.73_m2} (ref >59)
GFR calc non Af Amer: 87 mL/min/{1.73_m2} (ref >59)
Glucose: 103 mg/dL — ABNORMAL HIGH (ref 65–99)
Potassium: 4.1 mmol/L (ref 3.5–5.2)
Sodium: 143 mmol/L (ref 134–144)

## 2017-04-17 LAB — CBC
Hematocrit: 40.5 % (ref 34.0–46.6)
Hemoglobin: 13.8 g/dL (ref 11.1–15.9)
MCH: 29.7 pg (ref 26.6–33.0)
MCHC: 34.1 g/dL (ref 31.5–35.7)
MCV: 87 fL (ref 79–97)
Platelets: 172 10*3/uL (ref 150–379)
RBC: 4.65 x10E6/uL (ref 3.77–5.28)
RDW: 13.3 % (ref 12.3–15.4)
WBC: 6.1 10*3/uL (ref 3.4–10.8)

## 2017-04-17 LAB — PRO B NATRIURETIC PEPTIDE: NT-Pro BNP: 57 pg/mL (ref 0–287)

## 2017-04-17 LAB — TSH: TSH: 1.32 u[IU]/mL (ref 0.450–4.500)

## 2017-04-18 ENCOUNTER — Encounter: Payer: Self-pay | Admitting: Gynecology

## 2017-06-20 DIAGNOSIS — L03011 Cellulitis of right finger: Secondary | ICD-10-CM | POA: Diagnosis not present

## 2017-07-03 DIAGNOSIS — Z0000 Encounter for general adult medical examination without abnormal findings: Secondary | ICD-10-CM | POA: Diagnosis not present

## 2017-07-03 DIAGNOSIS — I10 Essential (primary) hypertension: Secondary | ICD-10-CM | POA: Diagnosis not present

## 2017-07-03 DIAGNOSIS — E7800 Pure hypercholesterolemia, unspecified: Secondary | ICD-10-CM | POA: Diagnosis not present

## 2017-08-16 DIAGNOSIS — L920 Granuloma annulare: Secondary | ICD-10-CM | POA: Diagnosis not present

## 2017-08-16 DIAGNOSIS — L821 Other seborrheic keratosis: Secondary | ICD-10-CM | POA: Diagnosis not present

## 2017-09-25 ENCOUNTER — Other Ambulatory Visit: Payer: Self-pay | Admitting: Geriatric Medicine

## 2017-09-25 DIAGNOSIS — F17210 Nicotine dependence, cigarettes, uncomplicated: Secondary | ICD-10-CM

## 2017-09-28 ENCOUNTER — Ambulatory Visit
Admission: RE | Admit: 2017-09-28 | Discharge: 2017-09-28 | Disposition: A | Discharge status: Home / Self Care | Payer: 59 | Source: Ambulatory Visit | Attending: Geriatric Medicine | Admitting: Geriatric Medicine

## 2017-09-28 DIAGNOSIS — F17210 Nicotine dependence, cigarettes, uncomplicated: Secondary | ICD-10-CM

## 2017-12-05 ENCOUNTER — Telehealth: Payer: Self-pay | Admitting: Interventional Cardiology

## 2017-12-05 NOTE — Telephone Encounter (Signed)
New Message   *STAT* If patient is at the pharmacy, call can be transferred to refill team.   1. Which medications need to be refilled? (please list name of each medication and dose if known) Chantix 0.5mg    2. Which pharmacy/location (including street and city if local pharmacy) is medication to be sent to? [facility]  3. Do they need a 30 day or 90 day supply? 90mg 

## 2017-12-06 NOTE — Telephone Encounter (Signed)
Called pt and left message informing pt that Dr. Tamala Julian did not prescribe Chantix and that the pt would have to contact PCP or the doctor that prescribed this medication which is Terrance Mass, MD, if she has any other problems, questions or concerns pertaining to cardiology, please call our office.

## 2017-12-07 ENCOUNTER — Telehealth: Payer: Self-pay | Admitting: *Deleted

## 2017-12-07 NOTE — Telephone Encounter (Signed)
Patient called former patient of Dr.Fernandez, has started a new smoking program, was informed best to start back on chantix to help stop smoking. I explained to patient Dr.Lavoie is out of the office the week due to vacation. Can check with PCP, as I cannot promise approval for Rx. Pt will follow up with PCP.

## 2017-12-10 ENCOUNTER — Other Ambulatory Visit: Payer: Self-pay | Admitting: Obstetrics & Gynecology

## 2017-12-10 DIAGNOSIS — Z139 Encounter for screening, unspecified: Secondary | ICD-10-CM

## 2018-01-04 ENCOUNTER — Ambulatory Visit (INDEPENDENT_AMBULATORY_CARE_PROVIDER_SITE_OTHER): Payer: 59 | Admitting: Obstetrics & Gynecology

## 2018-01-04 ENCOUNTER — Encounter: Payer: Self-pay | Admitting: Obstetrics & Gynecology

## 2018-01-04 VITALS — BP 130/80 | Ht 66.5 in | Wt 176.0 lb

## 2018-01-04 DIAGNOSIS — Z780 Asymptomatic menopausal state: Secondary | ICD-10-CM

## 2018-01-04 DIAGNOSIS — Z01411 Encounter for gynecological examination (general) (routine) with abnormal findings: Secondary | ICD-10-CM

## 2018-01-04 DIAGNOSIS — Z90722 Acquired absence of ovaries, bilateral: Secondary | ICD-10-CM

## 2018-01-04 DIAGNOSIS — Z9079 Acquired absence of other genital organ(s): Secondary | ICD-10-CM

## 2018-01-04 DIAGNOSIS — Z90710 Acquired absence of both cervix and uterus: Secondary | ICD-10-CM

## 2018-01-04 DIAGNOSIS — Z720 Tobacco use: Secondary | ICD-10-CM | POA: Diagnosis not present

## 2018-01-04 NOTE — Progress Notes (Signed)
Sonia Robertson [birth year] [id number]   History:    62 y.o. G3P2A1L2  Married x 7 years.  Works for [employer].  RP:  Established patient presenting for annual gyn exam   HPI: Menopause, stopped Estradiol patch 6 months ago.  Menopause well tolerated.  S/P TAH/BSO.  Quitting smoking on Chantix, currently down to 6 cig/day.  No pelvic pain.  No pain with intercourse.  Breasts wnl.  Urine/BMs wnl.  Health labs with Fam MD.  BMI 27.98  Past medical history,surgical history, family history and social history were all reviewed and documented in the EPIC chart.  Gynecologic History No LMP recorded. Patient has had a hysterectomy. Contraception: status post hysterectomy Last Pap: 2012. Results were: Negative Last mammogram: 01/2017. Results were: Negative Bone Density: 02/2016 Normal Colonoscopy: 2017  Obstetric History OB History  Gravida Para Term Preterm AB Living  3 2 2   1 2   SAB TAB Ectopic Multiple Live Births               # Outcome Date GA Lbr Len/2nd Weight Sex Delivery Anes PTL Lv  3 AB           2 Term           1 Term                ROS: A ROS was performed and pertinent positives and negatives are included in the history.  GENERAL: No fevers or chills. HEENT: No change in vision, no earache, sore throat or sinus congestion. NECK: No pain or stiffness. CARDIOVASCULAR: No chest pain or pressure. No palpitations. PULMONARY: No shortness of breath, cough or wheeze. GASTROINTESTINAL: No abdominal pain, nausea, vomiting or diarrhea, melena or bright red blood per rectum. GENITOURINARY: No urinary frequency, urgency, hesitancy or dysuria. MUSCULOSKELETAL: No joint or muscle pain, no back pain, no recent trauma. DERMATOLOGIC: No rash, no itching, no lesions. ENDOCRINE: No polyuria, polydipsia, no heat or cold intolerance. No recent change in weight. HEMATOLOGICAL: No anemia or easy bruising or bleeding. NEUROLOGIC: No headache, seizures, numbness, tingling or weakness.  PSYCHIATRIC: No depression, no loss of interest in normal activity or change in sleep pattern.     Exam:   BP 130/80   Ht 5' 6.5" (1.689 m)   Wt 176 lb (79.8 kg)   BMI 27.98 kg/m   Body mass index is 27.98 kg/m.  General appearance : Well developed well nourished female. No acute distress HEENT: Eyes: no retinal hemorrhage or exudates,  Neck supple, trachea midline, no carotid bruits, no thyroidmegaly Lungs: Clear to auscultation, no rhonchi or wheezes, or rib retractions  Heart: Regular rate and rhythm, no murmurs or gallops Breast:Examined in sitting and supine position were symmetrical in appearance, no palpable masses or tenderness,  no skin retraction, no nipple inversion, no nipple discharge, no skin discoloration, no axillary or supraclavicular lymphadenopathy Abdomen: no palpable masses or tenderness, no rebound or guarding Extremities: no edema or skin discoloration or tenderness  Pelvic: Vulva: Normal             Vagina: No gross lesions or discharge.  Pap reflex at vaginal vault.  Cervix/Uterus absent  Adnexa  Without masses or tenderness  Anus: Normal   Assessment/Plan:  62 y.o. female for annual exam   1. Encounter for gynecological examination with abnormal finding Gynecologic exam status post total hysterectomy.  Pap reflex done at vaginal vault.  Breast exam normal.  Will schedule screening mammogram.  Colonoscopy in 2017.  Health labs with family physician.  2. Menopause present Well without HRT.  Last bone density normal in 2017.  Will organize repeat bone density at 3 years.  Vitamin D supplements, calcium rich nutrition and regular weightbearing physical activity recommended.  3. S/P TAH-BSO  4. Currently attempting to quit smoking On Chantix, down to 6 cigarettes per day.  Encouraged to continue until completely stops.  Princess Bruins MD, 3:11 PM 01/04/2018

## 2018-01-08 ENCOUNTER — Encounter: Payer: Self-pay | Admitting: Obstetrics & Gynecology

## 2018-01-08 NOTE — Patient Instructions (Addendum)
1. Encounter for gynecological examination with abnormal finding Gynecologic exam status post total hysterectomy.  Pap reflex done at vaginal vault.  Breast exam normal.  Will schedule screening mammogram.  Colonoscopy in 2017.  Health labs with family physician.  2. Menopause present Well without HRT.   Last bone density normal in 2017.  Will organize repeat bone density at 3 years.  Vitamin D supplements, calcium rich nutrition and regular weightbearing physical activity recommended.  3. S/P TAH-BSO  4. Currently attempting to quit smoking On Chantix, down to 6 cigarettes per day.  Encouraged to continue until completely stops.  Sonia Robertson, it was a pleasure meeting you today!  I will inform you of your results as soon as they are available.   Health Maintenance for Postmenopausal Women Menopause is a normal process in which your reproductive ability comes to an end. This process happens gradually over a span of months to years, usually between the ages of 41 and 75. Menopause is complete when you have missed 12 consecutive menstrual periods. It is important to talk with your health care provider about some of the most common conditions that affect postmenopausal women, such as heart disease, cancer, and bone loss (osteoporosis). Adopting a healthy lifestyle and getting preventive care can help to promote your health and wellness. Those actions can also lower your chances of developing some of these common conditions. What should I know about menopause? During menopause, you may experience a number of symptoms, such as:  Moderate-to-severe hot flashes.  Night sweats.  Decrease in sex drive.  Mood swings.  Headaches.  Tiredness.  Irritability.  Memory problems.  Insomnia.  Choosing to treat or not to treat menopausal changes is an individual decision that you make with your health care provider. What should I know about hormone replacement therapy and supplements? Hormone therapy  products are effective for treating symptoms that are associated with menopause, such as hot flashes and night sweats. Hormone replacement carries certain risks, especially as you become older. If you are thinking about using estrogen or estrogen with progestin treatments, discuss the benefits and risks with your health care provider. What should I know about heart disease and stroke? Heart disease, heart attack, and stroke become more likely as you age. This may be due, in part, to the hormonal changes that your body experiences during menopause. These can affect how your body processes dietary fats, triglycerides, and cholesterol. Heart attack and stroke are both medical emergencies. There are many things that you can do to help prevent heart disease and stroke:  Have your blood pressure checked at least every 1-2 years. High blood pressure causes heart disease and increases the risk of stroke.  If you are 85-15 years old, ask your health care provider if you should take aspirin to prevent a heart attack or a stroke.  Do not use any tobacco products, including cigarettes, chewing tobacco, or electronic cigarettes. If you need help quitting, ask your health care provider.  It is important to eat a healthy diet and maintain a healthy weight. ? Be sure to include plenty of vegetables, fruits, low-fat dairy products, and lean protein. ? Avoid eating foods that are high in solid fats, added sugars, or salt (sodium).  Get regular exercise. This is one of the most important things that you can do for your health. ? Try to exercise for at least 150 minutes each week. The type of exercise that you do should increase your heart rate and make you sweat. This  is known as moderate-intensity exercise. ? Try to do strengthening exercises at least twice each week. Do these in addition to the moderate-intensity exercise.  Know your numbers.Ask your health care provider to check your cholesterol and your blood  glucose. Continue to have your blood tested as directed by your health care provider.  What should I know about cancer screening? There are several types of cancer. Take the following steps to reduce your risk and to catch any cancer development as early as possible. Breast Cancer  Practice breast self-awareness. ? This means understanding how your breasts normally appear and feel. ? It also means doing regular breast self-exams. Let your health care provider know about any changes, no matter how small.  If you are 69 or older, have a clinician do a breast exam (clinical breast exam or CBE) every year. Depending on your age, family history, and medical history, it may be recommended that you also have a yearly breast X-ray (mammogram).  If you have a family history of breast cancer, talk with your health care provider about genetic screening.  If you are at high risk for breast cancer, talk with your health care provider about having an MRI and a mammogram every year.  Breast cancer (BRCA) gene test is recommended for women who have family members with BRCA-related cancers. Results of the assessment will determine the need for genetic counseling and BRCA1 and for BRCA2 testing. BRCA-related cancers include these types: ? Breast. This occurs in males or females. ? Ovarian. ? Tubal. This may also be called fallopian tube cancer. ? Cancer of the abdominal or pelvic lining (peritoneal cancer). ? Prostate. ? Pancreatic.  Cervical, Uterine, and Ovarian Cancer Your health care provider may recommend that you be screened regularly for cancer of the pelvic organs. These include your ovaries, uterus, and vagina. This screening involves a pelvic exam, which includes checking for microscopic changes to the surface of your cervix (Pap test).  For women ages 21-65, health care providers may recommend a pelvic exam and a Pap test every three years. For women ages 74-65, they may recommend the Pap test  and pelvic exam, combined with testing for human papilloma virus (HPV), every five years. Some types of HPV increase your risk of cervical cancer. Testing for HPV may also be done on women of any age who have unclear Pap test results.  Other health care providers may not recommend any screening for nonpregnant women who are considered low risk for pelvic cancer and have no symptoms. Ask your health care provider if a screening pelvic exam is right for you.  If you have had past treatment for cervical cancer or a condition that could lead to cancer, you need Pap tests and screening for cancer for at least 20 years after your treatment. If Pap tests have been discontinued for you, your risk factors (such as having a new sexual partner) need to be reassessed to determine if you should start having screenings again. Some women have medical problems that increase the chance of getting cervical cancer. In these cases, your health care provider may recommend that you have screening and Pap tests more often.  If you have a family history of uterine cancer or ovarian cancer, talk with your health care provider about genetic screening.  If you have vaginal bleeding after reaching menopause, tell your health care provider.  There are currently no reliable tests available to screen for ovarian cancer.  Lung Cancer Lung cancer screening is recommended for  adults 9-67 years old who are at high risk for lung cancer because of a history of smoking. A yearly low-dose CT scan of the lungs is recommended if you:  Currently smoke.  Have a history of at least 30 pack-years of smoking and you currently smoke or have quit within the past 15 years. A pack-year is smoking an average of one pack of cigarettes per day for one year.  Yearly screening should:  Continue until it has been 15 years since you quit.  Stop if you develop a health problem that would prevent you from having lung cancer treatment.  Colorectal  Cancer  This type of cancer can be detected and can often be prevented.  Routine colorectal cancer screening usually begins at age 21 and continues through age 59.  If you have risk factors for colon cancer, your health care provider may recommend that you be screened at an earlier age.  If you have a family history of colorectal cancer, talk with your health care provider about genetic screening.  Your health care provider may also recommend using home test kits to check for hidden blood in your stool.  A small camera at the end of a tube can be used to examine your colon directly (sigmoidoscopy or colonoscopy). This is done to check for the earliest forms of colorectal cancer.  Direct examination of the colon should be repeated every 5-10 years until age 63. However, if early forms of precancerous polyps or small growths are found or if you have a family history or genetic risk for colorectal cancer, you may need to be screened more often.  Skin Cancer  Check your skin from head to toe regularly.  Monitor any moles. Be sure to tell your health care provider: ? About any new moles or changes in moles, especially if there is a change in a mole's shape or color. ? If you have a mole that is larger than the size of a pencil eraser.  If any of your family members has a history of skin cancer, especially at a young age, talk with your health care provider about genetic screening.  Always use sunscreen. Apply sunscreen liberally and repeatedly throughout the day.  Whenever you are outside, protect yourself by wearing long sleeves, pants, a wide-brimmed hat, and sunglasses.  What should I know about osteoporosis? Osteoporosis is a condition in which bone destruction happens more quickly than new bone creation. After menopause, you may be at an increased risk for osteoporosis. To help prevent osteoporosis or the bone fractures that can happen because of osteoporosis, the following is  recommended:  If you are 34-71 years old, get at least 1,000 mg of calcium and at least 600 mg of vitamin D per day.  If you are older than age 20 but younger than age 71, get at least 1,200 mg of calcium and at least 600 mg of vitamin D per day.  If you are older than age 88, get at least 1,200 mg of calcium and at least 800 mg of vitamin D per day.  Smoking and excessive alcohol intake increase the risk of osteoporosis. Eat foods that are rich in calcium and vitamin D, and do weight-bearing exercises several times each week as directed by your health care provider. What should I know about how menopause affects my mental health? Depression may occur at any age, but it is more common as you become older. Common symptoms of depression include:  Low or sad mood.  Changes in sleep patterns.  Changes in appetite or eating patterns.  Feeling an overall lack of motivation or enjoyment of activities that you previously enjoyed.  Frequent crying spells.  Talk with your health care provider if you think that you are experiencing depression. What should I know about immunizations? It is important that you get and maintain your immunizations. These include:  Tetanus, diphtheria, and pertussis (Tdap) booster vaccine.  Influenza every year before the flu season begins.  Pneumonia vaccine.  Shingles vaccine.  Your health care provider may also recommend other immunizations. This information is not intended to replace advice given to you by your health care provider. Make sure you discuss any questions you have with your health care provider. Document Released: 01/12/2006 Document Revised: 06/09/2016 Document Reviewed: 08/24/2015 Elsevier Interactive Patient Education  2018 [employer].

## 2018-01-10 ENCOUNTER — Other Ambulatory Visit: Payer: Self-pay | Admitting: Obstetrics & Gynecology

## 2018-01-10 DIAGNOSIS — I10 Essential (primary) hypertension: Secondary | ICD-10-CM | POA: Diagnosis not present

## 2018-01-10 LAB — PAP IG W/ RFLX HPV ASCU

## 2018-01-10 MED ORDER — METRONIDAZOLE 0.75 % VA GEL: VAGINAL | 0 refills | Status: DC

## 2018-01-23 ENCOUNTER — Ambulatory Visit
Admission: RE | Admit: 2018-01-23 | Discharge: 2018-01-23 | Disposition: A | Discharge status: Home / Self Care | Payer: 59 | Source: Ambulatory Visit | Attending: Obstetrics & Gynecology | Admitting: Obstetrics & Gynecology

## 2018-01-23 DIAGNOSIS — Z1231 Encounter for screening mammogram for malignant neoplasm of breast: Secondary | ICD-10-CM | POA: Diagnosis not present

## 2018-01-23 DIAGNOSIS — Z139 Encounter for screening, unspecified: Secondary | ICD-10-CM

## 2018-02-13 ENCOUNTER — Other Ambulatory Visit: Payer: Self-pay | Admitting: Interventional Cardiology

## 2018-05-08 ENCOUNTER — Other Ambulatory Visit: Payer: Self-pay | Admitting: Interventional Cardiology

## 2018-05-17 ENCOUNTER — Other Ambulatory Visit: Payer: Self-pay | Admitting: Interventional Cardiology

## 2018-05-20 NOTE — Telephone Encounter (Signed)
Outpatient Medication Detail    Disp Refills Start End   isosorbide mononitrate (IMDUR) 60 MG 24 hr tablet 90 tablet 0 05/08/2018    Sig - Route: Take 1 tablet (60 mg total) by mouth daily. Please keep upcoming appt in July for future refills. Thank you - Oral   Sent to pharmacy as: isosorbide mononitrate (IMDUR) 60 MG 24 hr tablet   E-Prescribing Status: Receipt confirmed by pharmacy (05/08/2018 1:50 PM EDT)   Pharmacy   [id number] - [place], [place]

## 2018-06-04 NOTE — Progress Notes (Signed)
Cardiology Office Note   Date:  06/05/2018   ID:  ALBIRTA RHINEHART, DOB 08/11/1956, MRN [id number]  PCP:  Lajean Manes, MD  Cardiologist:  Dr. Tamala Julian    Chief Complaint  Patient presents with  . Coronary Artery Disease      History of Present Illness: Sonia Robertson is a 62 y.o. female who presents for CAD.  She has a hx of PCI on diagonal in 1987.  Also with HLD and HTN.  Last visit with Dr. Tamala Julian 04/16/17.  At that time with lower ext edema Lt > Rt.   Cath 11 yrs ago, with severe diffuse vasoconstriction through out Lt coronary system LAD with 50-70% stenosis. Normal LV function.  Pro BNP 57, TSh 1.3  CBC normal.   Last seen 04/16/17 by Dr, Tamala Julian.    Today no chest pain, though + twinges of discomfort lasting seconds.  occ dizziness when stands but not frequent.  Her BP is lower today she will keep watch on BP.   Dr. Felipa Eth follows her lipids and well controlled.  She is stopping tobacco - on Chantix, congratulated.   Does not exercise, encouraged to do so by walking.   But at least she is stopping tobacco.  No lower ext edema.   Past Medical History:  Diagnosis Date  . Adopted    pt is adopted  . Angina    NONE SINCE MARCH   SEES DR H SMITH  -LOV 1'77 Epic.-, 08-11-16"remains with sporadic episodes of chest pain relived with NTG use_ Dr. Tamala Julian aware.  . Arthritis    "thinks arthritis in neck"  . Fibroid   . Hypertension   . Hypothyroidism   . Myocardial infarction ([place])    1988  . Shortness of breath    WITH EXERTION  . Thyroid disease     Past Surgical History:  Procedure Laterality Date  . ABDOMINAL HYSTERECTOMY  2004   TAH BSO  . BACK SURGERY     fusion lumbar  . CARDIAC CATHETERIZATION    . COLONOSCOPY WITH PROPOFOL N/A 08/14/2016   Procedure: COLONOSCOPY WITH PROPOFOL;  Surgeon: Garlan Fair, MD;  Location: [facility];  Service: Endoscopy;  Laterality: N/A;  . KNEE SURGERY     Arthroscopic  . OOPHORECTOMY     BSO     Current Outpatient Medications    Medication Sig Dispense Refill  . amLODipine (NORVASC) 10 MG tablet Take 10 mg by mouth daily.      Marland Kitchen aspirin EC 81 MG tablet Take 1 tablet (81 mg total) by mouth daily.    Marland Kitchen atenolol (TENORMIN) 25 MG tablet Take 25 mg by mouth daily. Takes 1/2 tablet(12.5 mg) daily AM/ 1/2 tablet (12.5 mg) daily PM = 25 mg daily total    . atorvastatin (LIPITOR) 40 MG tablet Take 20 mg by mouth daily. Takes 1/2 tablet(20 mg dosage) daily bedtime    . Calcium Carbonate-Vit D-Min (CALCIUM 1200 PO) Take 1 tablet by mouth daily.    . CHANTIX 0.5 MG tablet TAKE 1 BY MOUTH DAILY FOR 3 DAYS, THEN 1 BY MOUTH 2 TIMES DAILY FOR 4 DAYS, THEN 2 BY MOUTH 2 TIMES DAILY THEREAFTER 343 tablet 0  . Coenzyme Q10 (COQ10) 200 MG CAPS Take 1 capsule by mouth at bedtime.     Marland Kitchen ibuprofen (ADVIL,MOTRIN) 200 MG tablet Take 800 mg by mouth every 8 (eight) hours as needed. Pain/ swelling.  Only if she has not taken Diclofenac.    Marland Kitchen  isosorbide mononitrate (IMDUR) 60 MG 24 hr tablet Take 1 tablet (60 mg total) by mouth daily. Please keep upcoming appt in July for future refills. Thank you 90 tablet 3  . levothyroxine (SYNTHROID, LEVOTHROID) 125 MCG tablet Take 125 mcg by mouth daily before breakfast.    . metroNIDAZOLE (METROGEL) 0.75 % vaginal gel Use applicatorful hs in vagina x 5 nights. 70 g 0  . Multiple Vitamin (MULTIVITAMIN WITH MINERALS) TABS Take 1 tablet by mouth daily.    . nitroGLYCERIN (NITROSTAT) 0.4 MG SL tablet Place 1 tablet (0.4 mg total) under the tongue every 5 (five) minutes as needed for chest pain. 25 tablet 3  . Vitamin D, Cholecalciferol, 1000 UNITS TABS Take 1 tablet by mouth at bedtime.      No current facility-administered medications for this visit.     Allergies:   Morphine and related    Social History:  The patient  reports that she has been smoking cigarettes.  She has been smoking about 0.50 packs per day. She has never used smokeless tobacco. She reports that she does not drink alcohol or use drugs.    Family History:  The patient's family history includes Hypertension in her mother. She was adopted.    ROS:  General:no colds or fevers, no weight changes Skin:no rashes or ulcers HEENT:no blurred vision, no congestion CV:see HPI PUL:see HPI GI:no diarrhea constipation or melena, no indigestion GU:no hematuria, no dysuria MS:no joint pain, no claudication Neuro:no syncope, no lightheadedness Endo:no diabetes, + thyroid disease  Wt Readings from Last 3 Encounters:  06/05/18 173 lb 12.8 oz (78.8 kg)  01/04/18 176 lb (79.8 kg)  04/16/17 177 lb (80.3 kg)     PHYSICAL EXAM: VS:  BP 100/78   Pulse (!) 52   Ht 5' 6.5" (1.689 m)   Wt 173 lb 12.8 oz (78.8 kg)   SpO2 96%   BMI 27.63 kg/m  , BMI Body mass index is 27.63 kg/m. General:Pleasant affect, NAD Skin:Warm and dry, brisk capillary refill HEENT:normocephalic, sclera clear, mucus membranes moist Neck:supple, no JVD, no bruits  Heart:S1S2 RRR without murmur, gallup, rub or click Lungs:clear without rales, rhonchi, or wheezes UEA:VWUJ, non tender, + BS, do not palpate liver spleen or masses Ext:no lower ext edema, 2+ pedal pulses, 2+ radial pulses Neuro:alert and oriented X 3, MAE, follows commands, + facial symmetry    EKG:  EKG is ordered today. The ekg ordered today demonstrates SB at 52 and no changes from last year HR has been to 49 at times.     Recent Labs: No results found for requested labs within last 8760 hours.    Lipid Panel    Component Value Date/Time   CHOL 165 03/10/2015 1718   TRIG 68.0 03/10/2015 1718   HDL 45.10 03/10/2015 1718   CHOLHDL 4 03/10/2015 1718   VLDL 13.6 03/10/2015 1718   LDLCALC 106 (H) 03/10/2015 1718   LDLDIRECT 68.6 06/16/2014 0830       Other studies Reviewed: Additional studies/ records that were reviewed today include: previous OV. Last cardiac catheterization 11 years ago: CONCLUSIONS: 1. There is severe diffuse vasoconstriction throughout the entire  left coronary system, left anterior descending more involved than circumflex. The mid left anterior descending contains an eccentric 50-70% stenosis after intracoronary nitroglycerin. Prior to the intracoronary nitroglycerin, the stenosis was greater than 80%. 60% first diagonal. 2. Minimal luminal irregularities noted in the circumflex and right coronary. 3. Normal left ventricular function.      ASSESSMENT  AND PLAN:  1.   CAD  -prior PCI With twinges of chest pain, only lasting seconds.  Stable. Also with Vasospasm.  She will follow up in 1 year with Dr. Tamala Julian unless problems before and she will call.   2.    Tobacco use - using Chantix and stopping  3.    HTN controlled to low, she will monitor  4.    HLD per PCP last year LDL at goal of 68 (<70)    Current medicines are reviewed with the patient today.  The patient Has no concerns regarding medicines.  The following changes have been made:  See above Labs/ tests ordered today include:see above  Disposition:   FU:  see above  Signed, Cecilie Kicks, NP  06/05/2018 8:57 AM    [facility] [facility] [place], [place], [place], [place] Phone: [phone]; Fax: [phone]

## 2018-06-05 ENCOUNTER — Ambulatory Visit (INDEPENDENT_AMBULATORY_CARE_PROVIDER_SITE_OTHER): Payer: 59 | Admitting: Cardiology

## 2018-06-05 ENCOUNTER — Encounter: Payer: Self-pay | Admitting: Cardiology

## 2018-06-05 VITALS — BP 100/78 | HR 52 | Ht 66.5 in | Wt 173.8 lb

## 2018-06-05 DIAGNOSIS — I2510 Atherosclerotic heart disease of native coronary artery without angina pectoris: Secondary | ICD-10-CM | POA: Diagnosis not present

## 2018-06-05 DIAGNOSIS — F17200 Nicotine dependence, unspecified, uncomplicated: Secondary | ICD-10-CM | POA: Diagnosis not present

## 2018-06-05 DIAGNOSIS — E7849 Other hyperlipidemia: Secondary | ICD-10-CM

## 2018-06-05 DIAGNOSIS — I10 Essential (primary) hypertension: Secondary | ICD-10-CM | POA: Diagnosis not present

## 2018-06-05 MED ORDER — ISOSORBIDE MONONITRATE ER 60 MG PO TB24: 60.0000 mg | ORAL_TABLET | Freq: Every day | ORAL | 3 refills | Status: DC

## 2018-06-05 NOTE — Patient Instructions (Signed)
Medication Instructions: Your physician recommends that you continue on your current medications as directed. Please refer to the Current Medication list given to you today.   Labwork: None Ordered  Procedures/Testing: None Ordered  Follow-Up: Your physician wants you to follow-up in: 1 year with Dr. Gaspar Bidding will receive a reminder letter in the mail two months in advance. If you don't receive a letter, please call our office to schedule the follow-up appointment.   Any Additional Special Instructions Will Be Listed Below (If Applicable).     If you need a refill on your cardiac medications before your next appointment, please call your pharmacy.

## 2018-07-08 ENCOUNTER — Other Ambulatory Visit: Payer: Self-pay | Admitting: Geriatric Medicine

## 2018-07-08 DIAGNOSIS — E7800 Pure hypercholesterolemia, unspecified: Secondary | ICD-10-CM | POA: Diagnosis not present

## 2018-07-08 DIAGNOSIS — F17200 Nicotine dependence, unspecified, uncomplicated: Secondary | ICD-10-CM

## 2018-07-08 DIAGNOSIS — Z0000 Encounter for general adult medical examination without abnormal findings: Secondary | ICD-10-CM | POA: Diagnosis not present

## 2018-07-08 DIAGNOSIS — I10 Essential (primary) hypertension: Secondary | ICD-10-CM | POA: Diagnosis not present

## 2018-08-23 ENCOUNTER — Ambulatory Visit: Payer: 59 | Admitting: Interventional Cardiology

## 2018-10-10 ENCOUNTER — Ambulatory Visit
Admission: RE | Admit: 2018-10-10 | Discharge: 2018-10-10 | Disposition: A | Discharge status: Home / Self Care | Payer: 59 | Source: Ambulatory Visit | Attending: Geriatric Medicine | Admitting: Geriatric Medicine

## 2018-10-10 DIAGNOSIS — F17200 Nicotine dependence, unspecified, uncomplicated: Secondary | ICD-10-CM

## 2018-10-10 DIAGNOSIS — Z87891 Personal history of nicotine dependence: Secondary | ICD-10-CM | POA: Diagnosis not present

## 2019-01-01 ENCOUNTER — Other Ambulatory Visit: Payer: Self-pay | Admitting: Obstetrics & Gynecology

## 2019-01-01 DIAGNOSIS — Z1231 Encounter for screening mammogram for malignant neoplasm of breast: Secondary | ICD-10-CM

## 2019-01-06 ENCOUNTER — Ambulatory Visit (INDEPENDENT_AMBULATORY_CARE_PROVIDER_SITE_OTHER): Payer: 59 | Admitting: Obstetrics & Gynecology

## 2019-01-06 ENCOUNTER — Encounter: Payer: Self-pay | Admitting: Obstetrics & Gynecology

## 2019-01-06 VITALS — BP 140/88 | Ht 66.25 in | Wt 169.0 lb

## 2019-01-06 DIAGNOSIS — Z780 Asymptomatic menopausal state: Secondary | ICD-10-CM | POA: Diagnosis not present

## 2019-01-06 DIAGNOSIS — Z01419 Encounter for gynecological examination (general) (routine) without abnormal findings: Secondary | ICD-10-CM

## 2019-01-06 DIAGNOSIS — F17210 Nicotine dependence, cigarettes, uncomplicated: Secondary | ICD-10-CM

## 2019-01-06 DIAGNOSIS — Z9079 Acquired absence of other genital organ(s): Secondary | ICD-10-CM

## 2019-01-06 DIAGNOSIS — Z90710 Acquired absence of both cervix and uterus: Secondary | ICD-10-CM | POA: Diagnosis not present

## 2019-01-06 DIAGNOSIS — Z90722 Acquired absence of ovaries, bilateral: Secondary | ICD-10-CM

## 2019-01-06 NOTE — Patient Instructions (Signed)
1. Well female exam with routine gynecological exam Gynecologic exam status post TAH/BSO.  Pap test negative in February 2019.  No indication to repeat this year.  Breast exam normal.  Last mammogram February 2019, patient has a screening mammogram schedule in February 2020.  Colonoscopy in 2017.  Health labs with family physician.  2. S/P TAH-BSO  3. Postmenopausal Well on no hormone replacement therapy.  Last bone density in March 2017 was normal.  Will repeat at 5 years.  Vitamin D supplements, calcium intake of 1200-1500 mg daily including nutritional and supplemental, regular weightbearing physical activities recommended.  4. Cigarette smoker Strongly recommended to quit cigarette smoking.  Other orders - cimetidine (TAGAMET) 200 MG tablet; Take 200 mg by mouth 2 (two) times daily.  Sonia Robertson, it was a pleasure seeing you today!

## 2019-01-06 NOTE — Progress Notes (Signed)
Sonia Robertson 1956-09-30 [id number]   History:    63 y.o. G3P2A1L2 Married  RP:  Established patient presenting for annual gyn exam   HPI: S/P TAH/BSO.  No pelvic pain.  No pain with intercourse.  Urine and bowel movements normal.  Breasts normal.  Body mass index 27.07.  Mild to moderate physical activity.  Was attempting to quit smoking last year but resumed.  Health labs with family physician.  Past medical history,surgical history, family history and social history were all reviewed and documented in the EPIC chart.  Gynecologic History No LMP recorded. Patient has had a hysterectomy. Contraception: status post hysterectomy Last Pap: 01/2018. Results were: Negative Last mammogram: 01/2018. Results were: Negative Bone Density: 02/2016 Normal.  Will repeat at 3 yrs Colonoscopy: 2017  Obstetric History OB History  Gravida Para Term Preterm AB Living  3 2 2   1 2   SAB TAB Ectopic Multiple Live Births               # Outcome Date GA Lbr Len/2nd Weight Sex Delivery Anes PTL Lv  3 AB           2 Term           1 Term              ROS: A ROS was performed and pertinent positives and negatives are included in the history.  GENERAL: No fevers or chills. HEENT: No change in vision, no earache, sore throat or sinus congestion. NECK: No pain or stiffness. CARDIOVASCULAR: No chest pain or pressure. No palpitations. PULMONARY: No shortness of breath, cough or wheeze. GASTROINTESTINAL: No abdominal pain, nausea, vomiting or diarrhea, melena or bright red blood per rectum. GENITOURINARY: No urinary frequency, urgency, hesitancy or dysuria. MUSCULOSKELETAL: No joint or muscle pain, no back pain, no recent trauma. DERMATOLOGIC: No rash, no itching, no lesions. ENDOCRINE: No polyuria, polydipsia, no heat or cold intolerance. No recent change in weight. HEMATOLOGICAL: No anemia or easy bruising or bleeding. NEUROLOGIC: No headache, seizures, numbness, tingling or weakness. PSYCHIATRIC: No  depression, no loss of interest in normal activity or change in sleep pattern.     Exam:   BP 140/88   Ht 5' 6.25" (1.683 m)   Wt 169 lb (76.7 kg)   BMI 27.07 kg/m   Body mass index is 27.07 kg/m.  General appearance : Well developed well nourished female. No acute distress HEENT: Eyes: no retinal hemorrhage or exudates,  Neck supple, trachea midline, no carotid bruits, no thyroidmegaly Lungs: Clear to auscultation, no rhonchi or wheezes, or rib retractions  Heart: Regular rate and rhythm, no murmurs or gallops Breast:Examined in sitting and supine position were symmetrical in appearance, no palpable masses or tenderness,  no skin retraction, no nipple inversion, no nipple discharge, no skin discoloration, no axillary or supraclavicular lymphadenopathy Abdomen: no palpable masses or tenderness, no rebound or guarding Extremities: no edema or skin discoloration or tenderness  Pelvic: Vulva: Normal             Vagina: No gross lesions or discharge  Cervix/Uterus absent  Adnexa  Without masses or tenderness  Anus: Normal   Assessment/Plan:  63 y.o. female for annual exam   1. Well female exam with routine gynecological exam Gynecologic exam status post TAH/BSO.  Pap test negative in February 2019.  No indication to repeat this year.  Breast exam normal.  Last mammogram February 2019, patient has a screening mammogram schedule in February 2020.  Colonoscopy in 2017.  Health labs with family physician.  2. S/P TAH-BSO  3. Postmenopausal Well on no hormone replacement therapy.  Last bone density in March 2017 was normal.  Will repeat at 5 years.  Vitamin D supplements, calcium intake of 1200-1500 mg daily including nutritional and supplemental, regular weightbearing physical activities recommended.  4. Cigarette smoker Strongly recommended to quit cigarette smoking.  Princess Bruins MD, 4:14 PM 01/06/2019

## 2019-01-08 DIAGNOSIS — E7800 Pure hypercholesterolemia, unspecified: Secondary | ICD-10-CM | POA: Diagnosis not present

## 2019-01-08 DIAGNOSIS — I10 Essential (primary) hypertension: Secondary | ICD-10-CM | POA: Diagnosis not present

## 2019-01-08 DIAGNOSIS — F17210 Nicotine dependence, cigarettes, uncomplicated: Secondary | ICD-10-CM | POA: Diagnosis not present

## 2019-01-31 ENCOUNTER — Ambulatory Visit
Admission: RE | Admit: 2019-01-31 | Discharge: 2019-01-31 | Disposition: A | Discharge status: Home / Self Care | Payer: 59 | Source: Ambulatory Visit | Attending: Obstetrics & Gynecology | Admitting: Obstetrics & Gynecology

## 2019-01-31 DIAGNOSIS — Z1231 Encounter for screening mammogram for malignant neoplasm of breast: Secondary | ICD-10-CM

## 2019-06-20 ENCOUNTER — Telehealth: Payer: Self-pay | Admitting: Nurse Practitioner

## 2019-06-20 NOTE — Progress Notes (Signed)
Cardiology Office Note:    Date:  06/23/2019   ID:  Sonia Robertson, DOB 07-23-1956, MRN [id number]  PCP:  Lajean Manes, MD  Cardiologist:  Sinclair Grooms, MD  Electrophysiologist:  None   Referring MD: Lajean Manes, MD   Chief Complaint: 63 yo female presents for annual follow up of CAD, HTN, HLD.   History of Present Illness:    Sonia Robertson is a 63 y.o. female with a hx of CAD s/p PCI diagonal 1987, HTN, HLD, tobacco use, lumbar disc disease. Last seen 06/05/18 by Cecilie Kicks, NP. Her last cardiac catheterization was 11 years ago. Showed severe diffuse vasoconstriction throughout left coronary system with mid LAD 50-70%.   She reports she is doing well. Denies chest pain, edema, palpitations, dizziness. She works for [employer] and has been working from home. She has been using nicotine patches to quit smoking and is down to 3 cigarettes per day. Is having difficulty quitting due to stress of working from home and the fact that her husband smokes.   She does not have a formal exercise regimen, but does walk the dog. She tries to eat a heart healthy diet. Eats mostly at home. Does not add salt to food.   Past Medical History:  Diagnosis Date  . Adopted    pt is adopted  . Angina    NONE SINCE MARCH   SEES DR H SMITH  -LOV 8'65 Epic.-, 08-11-16"remains with sporadic episodes of chest pain relived with NTG use_ Dr. Tamala Julian aware.  . Arthritis    "thinks arthritis in neck"  . Fibroid   . Hypertension   . Hypothyroidism   . Myocardial infarction ([place])    1988  . Shortness of breath    WITH EXERTION  . Thyroid disease     Past Surgical History:  Procedure Laterality Date  . ABDOMINAL HYSTERECTOMY  2004   TAH BSO  . BACK SURGERY     fusion lumbar  . CARDIAC CATHETERIZATION    . COLONOSCOPY WITH PROPOFOL N/A 08/14/2016   Procedure: COLONOSCOPY WITH PROPOFOL;  Surgeon: Garlan Fair, MD;  Location: [facility];  Service: Endoscopy;  Laterality: N/A;  . KNEE  SURGERY     Arthroscopic  . OOPHORECTOMY     BSO    Current Medications: Current Meds  Medication Sig  . amLODipine (NORVASC) 10 MG tablet Take 10 mg by mouth daily.    Marland Kitchen aspirin EC 81 MG tablet Take 1 tablet (81 mg total) by mouth daily.  Marland Kitchen atenolol (TENORMIN) 25 MG tablet Take 25 mg by mouth daily. Takes 1/2 tablet(12.5 mg) daily AM/ 1/2 tablet (12.5 mg) daily PM = 25 mg daily total  . atorvastatin (LIPITOR) 40 MG tablet Take 20 mg by mouth daily. Takes 1/2 tablet(20 mg dosage) daily bedtime  . Calcium Carbonate-Vit D-Min (CALCIUM 1200 PO) Take 1 tablet by mouth daily.  . cimetidine (TAGAMET) 200 MG tablet Take 200 mg by mouth 2 (two) times daily.  . Coenzyme Q10 (COQ10) 200 MG CAPS Take 1 capsule by mouth at bedtime.   Marland Kitchen ibuprofen (ADVIL,MOTRIN) 200 MG tablet Take 800 mg by mouth every 8 (eight) hours as needed. Pain/ swelling.  Only if she has not taken Diclofenac.  . isosorbide mononitrate (IMDUR) 60 MG 24 hr tablet Take 1 tablet (60 mg total) by mouth daily.  Marland Kitchen levothyroxine (SYNTHROID, LEVOTHROID) 125 MCG tablet Take 125 mcg by mouth daily before breakfast.  . Multiple Vitamin (MULTIVITAMIN WITH  MINERALS) TABS Take 1 tablet by mouth daily.  . nitroGLYCERIN (NITROSTAT) 0.4 MG SL tablet Place 1 tablet (0.4 mg total) under the tongue every 5 (five) minutes as needed for chest pain.  . Vitamin D, Cholecalciferol, 1000 UNITS TABS Take 1 tablet by mouth at bedtime.   . [DISCONTINUED] CHANTIX 0.5 MG tablet TAKE 1 BY MOUTH DAILY FOR 3 DAYS, THEN 1 BY MOUTH 2 TIMES DAILY FOR 4 DAYS, THEN 2 BY MOUTH 2 TIMES DAILY THEREAFTER  . [DISCONTINUED] isosorbide mononitrate (IMDUR) 60 MG 24 hr tablet Take 1 tablet (60 mg total) by mouth daily. Please keep upcoming appt in July for future refills. Thank you     Allergies:   Morphine and related   Social History   Socioeconomic History  . Marital status: Married    Spouse name: Not on file  . Number of children: Not on file  . Years of education:  Not on file  . Highest education level: Not on file  Occupational History  . Not on file  Social Needs  . Financial resource strain: Not on file  . Food insecurity    Worry: Not on file    Inability: Not on file  . Transportation needs    Medical: Not on file    Non-medical: Not on file  Tobacco Use  . Smoking status: Current Every Day Smoker    Packs/day: 0.50    Types: Cigarettes  . Smokeless tobacco: Never Used  . Tobacco comment: Chantix is helping decrease use  Substance and Sexual Activity  . Alcohol use: No    Alcohol/week: 0.0 standard drinks  . Drug use: No  . Sexual activity: Yes    Birth control/protection: Surgical    Comment: 1ST INTERCOURSE- 13, PARTNERS- GREATER THAN 5  Lifestyle  . Physical activity    Days per week: Not on file    Minutes per session: Not on file  . Stress: Not on file  Relationships  . Social Herbalist on phone: Not on file    Gets together: Not on file    Attends religious service: Not on file    Active member of club or organization: Not on file    Attends meetings of clubs or organizations: Not on file    Relationship status: Not on file  Other Topics Concern  . Not on file  Social History Narrative  . Not on file    Family History: The patient's family history includes Hypertension in her mother. There is no history of Breast cancer. She was adopted.  ROS:   Please see the history of present illness.    Review of Systems  Constitution: Negative for chills, fever and malaise/fatigue.  Respiratory: Negative for cough, shortness of breath and wheezing.   Gastrointestinal: Negative for nausea and vomiting.  Neurological: Negative for dizziness, light-headedness and weakness.   All other systems reviewed and are negative.  EKGs/Labs/Other Studies Reviewed:    The following studies were reviewed today: CT Chest Lung Cancer Screen 10/10/18 IMPRESSION: 1. Stable exam. Lung-RADS 2, benign appearance or behavior.  Continue annual screening with low-dose chest CT without contrast in 12 months. 2.  Emphysema. (ICD10-J43.9) 3.  Aortic Atherosclerois (ICD10-170.0)   Last cardiac catheterization 12 years ago: CONCLUSIONS: 1. There is severe diffuse vasoconstriction throughout the entire left coronary system, left anterior descending more involved than circumflex. The mid left anterior descending contains an eccentric 50-70% stenosis after intracoronary nitroglycerin. Prior to the intracoronary nitroglycerin, the stenosis  was greater than 80%.60% first diagonal. 2. Minimal luminal irregularities noted in the circumflex and right coronary. 3. Normal left ventricular function.   EKG:  EKG is  ordered today.  The ekg ordered today demonstrates sinus bradycardia, nonspecific diffuse ST/T wave changes, stable when compared to previous.   Recent Labs: Labs 01/08/2019 via [facility]: creatinine 0.74, K 4.3,  Labs 07/2018 via [facility]: ALT 11, AST 13  Recent Lipid Panel 07/2018 via [facility]: Total cholesterol 141, HDL 44, LDL 76, triglycerides 103  Physical Exam:    VS:  BP 124/80   Pulse (!) 57   Ht 5\' 7"  (1.702 m)   Wt 174 lb 12.8 oz (79.3 kg)   SpO2 92%   BMI 27.38 kg/m     Wt Readings from Last 3 Encounters:  06/23/19 174 lb 12.8 oz (79.3 kg)  01/06/19 169 lb (76.7 kg)  06/05/18 173 lb 12.8 oz (78.8 kg)     GEN:  Well nourished, well developed in no acute distress HEENT: Normal NECK: No JVD; No carotid bruits LYMPHATICS: No lymphadenopathy CARDIAC: RRR, no murmurs, rubs, gallops RESPIRATORY:   Clear to auscultation without rales, wheezing or rhonchi  ABDOMEN: Soft, non-tender, non-distended MUSCULOSKELETAL:  No  edema; No deformity  SKIN: Warm and dry NEUROLOGIC:  Alert and oriented x 3 PSYCHIATRIC:  Normal affect   ASSESSMENT:    1. Coronary artery disease involving native coronary artery of native heart with angina pectoris ([place])   2. Essential hypertension   3.  Other hyperlipidemia   4. Tobacco use    PLAN:    In order of problems listed above:  1. CAD - Stable. No anginal symptoms. Continue GDMT aspirin, statin, beta blocker, Imdur. Risk factor modification includes heart healthy diet, exercise, and smoking cessation.  2. HTN - BP well controlled today. Does not check routinely at home. Encouraged low salt, heart healthy diet. Encouraged 30 minutes of moderate intensity exercise 3-4 times per week per American Heart Association guidelines. Continue present anti-hypertensive regimen.  3. HLD - Last lipid profile 07/2018 with LDL 76. LDL goal <70. Will recheck lipid profile and CMET today. Continue Atorvastatin 20mg  daily.  4. Tobacco use - Continues to smoke 3 cigarettes per day. Has been using nicotine patches provided by work to cut back. Encouraged her to continue.    Medication Adjustments/Labs and Tests Ordered: Current medicines are reviewed at length with the patient today.  Concerns regarding medicines are outlined above.  Orders Placed This Encounter  Procedures  . Comprehensive metabolic panel  . Lipid panel  . EKG 12-Lead   Meds ordered this encounter  Medications  . isosorbide mononitrate (IMDUR) 60 MG 24 hr tablet    Sig: Take 1 tablet (60 mg total) by mouth daily.    Dispense:  90 tablet    Refill:  3    Order Specific Question:   Supervising Provider    Answer:   Richardo Priest [[id number]]    Patient Instructions  Medication Instructions:  Continue your current medications.  If you need a refill on your cardiac medications before your next appointment, please call your pharmacy.   Lab work: CMET and Lipid profile today.  If you have labs (blood work) drawn today and your tests are completely normal, you will receive your results only by: Marland Kitchen MyChart Message (if you have MyChart) OR . A paper copy in the mail If you have any lab test that is abnormal or we need to change your treatment, we will  call you to review the  results.  Testing/Procedures: None ordered today.   Follow-Up: At [facility], you and your health needs are our priority.  As part of our continuing mission to provide you with exceptional heart care, we have created designated Provider Care Teams.  These Care Teams include your primary Cardiologist (physician) and Advanced Practice Providers (APPs -  Physician Assistants and Nurse Practitioners) who all work together to provide you with the care you need, when you need it. Follow up in 1 year.  Any Other Special Instructions Will Be Listed Below (If Applicable). Keep up the good efforts to quit smoking! Remember all you can do is your best and you're doing just that.       Signed, Loel Dubonnet, NP  06/23/2019 9:45 AM    [place]

## 2019-06-20 NOTE — Telephone Encounter (Signed)

## 2019-06-23 ENCOUNTER — Encounter: Payer: Self-pay | Admitting: Family

## 2019-06-23 ENCOUNTER — Ambulatory Visit (INDEPENDENT_AMBULATORY_CARE_PROVIDER_SITE_OTHER): Payer: 59 | Admitting: Family

## 2019-06-23 ENCOUNTER — Other Ambulatory Visit: Payer: Self-pay

## 2019-06-23 VITALS — BP 124/80 | HR 57 | Ht 67.0 in | Wt 174.8 lb

## 2019-06-23 DIAGNOSIS — Z720 Tobacco use: Secondary | ICD-10-CM | POA: Diagnosis not present

## 2019-06-23 DIAGNOSIS — I10 Essential (primary) hypertension: Secondary | ICD-10-CM

## 2019-06-23 DIAGNOSIS — I25119 Atherosclerotic heart disease of native coronary artery with unspecified angina pectoris: Secondary | ICD-10-CM | POA: Diagnosis not present

## 2019-06-23 DIAGNOSIS — E7849 Other hyperlipidemia: Secondary | ICD-10-CM

## 2019-06-23 LAB — LIPID PANEL
Chol/HDL Ratio: 3.2 ratio (ref 0.0–4.4)
Cholesterol, Total: 137 mg/dL (ref 100–199)
HDL: 43 mg/dL (ref >39)
LDL Calculated: 77 mg/dL (ref 0–99)
Triglycerides: 87 mg/dL (ref 0–149)
VLDL Cholesterol Cal: 17 mg/dL (ref 5–40)

## 2019-06-23 LAB — COMPREHENSIVE METABOLIC PANEL
ALT: 10 IU/L (ref 0–32)
AST: 12 IU/L (ref 0–40)
Albumin/Globulin Ratio: 2.3 — ABNORMAL HIGH (ref 1.2–2.2)
Albumin: 4.5 g/dL (ref 3.8–4.8)
Alkaline Phosphatase: 80 IU/L (ref 39–117)
BUN/Creatinine Ratio: 19 (ref 12–28)
BUN: 14 mg/dL (ref 8–27)
Bilirubin Total: 0.7 mg/dL (ref 0.0–1.2)
CO2: 22 mmol/L (ref 20–29)
Calcium: 9.3 mg/dL (ref 8.7–10.3)
Chloride: 104 mmol/L (ref 96–106)
Creatinine, Ser: 0.72 mg/dL (ref 0.57–1.00)
GFR calc Af Amer: 102 mL/min/{1.73_m2} (ref >59)
GFR calc non Af Amer: 89 mL/min/{1.73_m2} (ref >59)
Globulin, Total: 2 g/dL (ref 1.5–4.5)
Glucose: 102 mg/dL — ABNORMAL HIGH (ref 65–99)
Potassium: 4.1 mmol/L (ref 3.5–5.2)
Sodium: 144 mmol/L (ref 134–144)
Total Protein: 6.5 g/dL (ref 6.0–8.5)

## 2019-06-23 MED ORDER — ISOSORBIDE MONONITRATE ER 60 MG PO TB24: 60.0000 mg | ORAL_TABLET | Freq: Every day | ORAL | 3 refills | Status: DC

## 2019-06-23 NOTE — Patient Instructions (Signed)
Medication Instructions:  Continue your current medications.  If you need a refill on your cardiac medications before your next appointment, please call your pharmacy.   Lab work: CMET and Lipid profile today.  If you have labs (blood work) drawn today and your tests are completely normal, you will receive your results only by: Marland Kitchen MyChart Message (if you have MyChart) OR . A paper copy in the mail If you have any lab test that is abnormal or we need to change your treatment, we will call you to review the results.  Testing/Procedures: None ordered today.   Follow-Up: At [facility], you and your health needs are our priority.  As part of our continuing mission to provide you with exceptional heart care, we have created designated Provider Care Teams.  These Care Teams include your primary Cardiologist (physician) and Advanced Practice Providers (APPs -  Physician Assistants and Nurse Practitioners) who all work together to provide you with the care you need, when you need it. Follow up in 1 year.  Any Other Special Instructions Will Be Listed Below (If Applicable). Keep up the good efforts to quit smoking! Remember all you can do is your best and you're doing just that.

## 2019-06-24 ENCOUNTER — Other Ambulatory Visit: Payer: Self-pay | Admitting: Family

## 2019-06-24 ENCOUNTER — Encounter: Payer: Self-pay | Admitting: Family

## 2019-06-24 DIAGNOSIS — I25119 Atherosclerotic heart disease of native coronary artery with unspecified angina pectoris: Secondary | ICD-10-CM

## 2019-06-24 DIAGNOSIS — E7849 Other hyperlipidemia: Secondary | ICD-10-CM

## 2019-06-24 MED ORDER — ATORVASTATIN CALCIUM 40 MG PO TABS: 40.0000 mg | ORAL_TABLET | Freq: Every day | ORAL | 3 refills | Status: DC

## 2019-06-24 NOTE — Progress Notes (Signed)
error 

## 2019-06-24 NOTE — Progress Notes (Signed)
Please call the patient with the following changes...  Liver, kidneys, electrolytes all good. HDL (good cholesterol) is at goal of >40! Would like LDL to be <70 due to history of coronary artery disease. Please increase Atorvastatin to 40mg  daily (one full tablet). Will recheck her lipid panel and liver function in 6 weeks after medication adjustment. (please place orders)

## 2019-06-25 ENCOUNTER — Other Ambulatory Visit: Payer: Self-pay

## 2019-06-25 DIAGNOSIS — E7849 Other hyperlipidemia: Secondary | ICD-10-CM

## 2019-06-25 NOTE — Progress Notes (Signed)
Orders entered for repeat lipid profile and liver panel after increasing statin

## 2019-06-30 ENCOUNTER — Encounter: Payer: Self-pay | Admitting: Family

## 2019-07-03 ENCOUNTER — Telehealth: Payer: Self-pay | Admitting: Family

## 2019-07-03 NOTE — Telephone Encounter (Signed)
New message   Patient states that she is returning a call from [facility].  Please call.

## 2019-07-03 NOTE — Telephone Encounter (Signed)
See documentation on lab results ./cy

## 2019-07-30 ENCOUNTER — Other Ambulatory Visit: Payer: Self-pay | Admitting: Geriatric Medicine

## 2019-07-30 DIAGNOSIS — F17209 Nicotine dependence, unspecified, with unspecified nicotine-induced disorders: Secondary | ICD-10-CM

## 2019-08-06 ENCOUNTER — Ambulatory Visit: Payer: 59

## 2019-10-15 ENCOUNTER — Ambulatory Visit
Admission: RE | Admit: 2019-10-15 | Discharge: 2019-10-15 | Disposition: A | Discharge status: Home / Self Care | Payer: 59 | Source: Ambulatory Visit | Attending: Geriatric Medicine | Admitting: Geriatric Medicine

## 2019-10-15 DIAGNOSIS — F17209 Nicotine dependence, unspecified, with unspecified nicotine-induced disorders: Secondary | ICD-10-CM

## 2020-01-08 ENCOUNTER — Encounter: Payer: 59 | Admitting: Obstetrics & Gynecology

## 2020-01-19 ENCOUNTER — Other Ambulatory Visit: Payer: Self-pay

## 2020-01-20 ENCOUNTER — Encounter: Payer: Self-pay | Admitting: Obstetrics & Gynecology

## 2020-01-20 ENCOUNTER — Ambulatory Visit (INDEPENDENT_AMBULATORY_CARE_PROVIDER_SITE_OTHER): Payer: 59 | Admitting: Obstetrics & Gynecology

## 2020-01-20 VITALS — Ht 67.0 in | Wt 169.0 lb

## 2020-01-20 DIAGNOSIS — Z90710 Acquired absence of both cervix and uterus: Secondary | ICD-10-CM

## 2020-01-20 DIAGNOSIS — Z780 Asymptomatic menopausal state: Secondary | ICD-10-CM | POA: Diagnosis not present

## 2020-01-20 DIAGNOSIS — Z01419 Encounter for gynecological examination (general) (routine) without abnormal findings: Secondary | ICD-10-CM

## 2020-01-20 DIAGNOSIS — Z9079 Acquired absence of other genital organ(s): Secondary | ICD-10-CM

## 2020-01-20 DIAGNOSIS — F17210 Nicotine dependence, cigarettes, uncomplicated: Secondary | ICD-10-CM

## 2020-01-20 DIAGNOSIS — Z90722 Acquired absence of ovaries, bilateral: Secondary | ICD-10-CM

## 2020-01-20 NOTE — Progress Notes (Signed)
Sonia Robertson 1956-03-27 [id number]   History:    64 y.o. [id number] Married  RP:  Established patient presenting for annual gyn exam   HPI: S/P TAH/BSO.  Postmenopause, on no HRT x 2 yrs.  Still having hot flushes.  No night sweats.  No pelvic pain.  No pain with intercourse.  Urine and bowel movements normal.  Breasts normal.  Body mass index improved to 26.47 this year.  Not exercising regularly.  Still smoking.  Stress ++ from working at home.  Health labs with family physician.  Past medical history,surgical history, family history and social history were all reviewed and documented in the EPIC chart.  Gynecologic History No LMP recorded. Patient has had a hysterectomy.  Obstetric History OB History  Gravida Para Term Preterm AB Living  3 2 2   1 2   SAB TAB Ectopic Multiple Live Births               # Outcome Date GA Lbr Len/2nd Weight Sex Delivery Anes PTL Lv  3 AB           2 Term           1 Term              ROS: A ROS was performed and pertinent positives and negatives are included in the history.  GENERAL: No fevers or chills. HEENT: No change in vision, no earache, sore throat or sinus congestion. NECK: No pain or stiffness. CARDIOVASCULAR: No chest pain or pressure. No palpitations. PULMONARY: No shortness of breath, cough or wheeze. GASTROINTESTINAL: No abdominal pain, nausea, vomiting or diarrhea, melena or bright red blood per rectum. GENITOURINARY: No urinary frequency, urgency, hesitancy or dysuria. MUSCULOSKELETAL: No joint or muscle pain, no back pain, no recent trauma. DERMATOLOGIC: No rash, no itching, no lesions. ENDOCRINE: No polyuria, polydipsia, no heat or cold intolerance. No recent change in weight. HEMATOLOGICAL: No anemia or easy bruising or bleeding. NEUROLOGIC: No headache, seizures, numbness, tingling or weakness. PSYCHIATRIC: No depression, no loss of interest in normal activity or change in sleep pattern.     Exam:   Ht 5\' 7"  (1.702 m)   Wt  169 lb (76.7 kg)   BMI 26.47 kg/m   Body mass index is 26.47 kg/m.  General appearance : Well developed well nourished female. No acute distress HEENT: Eyes: no retinal hemorrhage or exudates,  Neck supple, trachea midline, no carotid bruits, no thyroidmegaly Lungs: Clear to auscultation, no rhonchi or wheezes, or rib retractions  Heart: Regular rate and rhythm, no murmurs or gallops Breast:Examined in sitting and supine position were symmetrical in appearance, no palpable masses or tenderness,  no skin retraction, no nipple inversion, no nipple discharge, no skin discoloration, no axillary or supraclavicular lymphadenopathy Abdomen: no palpable masses or tenderness, no rebound or guarding Extremities: no edema or skin discoloration or tenderness  Pelvic: Vulva: Normal             Vagina: No gross lesions or discharge  Cervix/Uterus absent  Adnexa  Without masses or tenderness  Anus: Normal   Assessment/Plan:  64 y.o. female for annual exam   1. Well female exam with routine gynecological exam Gynecologic exam status post total abdominal hysterectomy with BSO.  Pap test negative in February 2019, no indication to repeat this year.  Breast exam normal.  Mammogram negative February 2020, will repeat now.  Colonoscopy 2017.  Health labs with family physician.  Body mass index 26.47.  Recommend aerobic  activities 5 times a week and light weightlifting every 2 days.  Recommend a healthy nutrition with plenty of vegetables.  2. S/P TAH-BSO  3. Postmenopausal Postmenopausal, on no hormone replacement therapy.  Still having vasomotor symptoms.  No pain with intercourse.  Bone density completely normal in March 2017, recommend repeat at 5 years.  Vitamin D supplements, calcium intake of 1200 mg daily and regular weightbearing physical activity is recommended.  4. Cigarette smoker Strongly recommended to quit smoking.  We will start with physical activities to decrease her stress and  progressively reach a point where she can stop smoking.  Princess Bruins MD, 10:17 AM 01/20/2020

## 2020-01-21 ENCOUNTER — Other Ambulatory Visit: Payer: Self-pay | Admitting: Obstetrics & Gynecology

## 2020-01-21 DIAGNOSIS — Z1231 Encounter for screening mammogram for malignant neoplasm of breast: Secondary | ICD-10-CM

## 2020-01-22 ENCOUNTER — Encounter: Payer: Self-pay | Admitting: Obstetrics & Gynecology

## 2020-01-22 NOTE — Patient Instructions (Signed)
1. Well female exam with routine gynecological exam Gynecologic exam status post total abdominal hysterectomy with BSO.  Pap test negative in February 2019, no indication to repeat this year.  Breast exam normal.  Mammogram negative February 2020, will repeat now.  Colonoscopy 2017.  Health labs with family physician.  Body mass index 26.47.  Recommend aerobic activities 5 times a week and light weightlifting every 2 days.  Recommend a healthy nutrition with plenty of vegetables.  2. S/P TAH-BSO  3. Postmenopausal Postmenopausal, on no hormone replacement therapy.  Still having vasomotor symptoms.  No pain with intercourse.  Bone density completely normal in March 2017, recommend repeat at 5 years.  Vitamin D supplements, calcium intake of 1200 mg daily and regular weightbearing physical activity is recommended.  4. Cigarette smoker Strongly recommended to quit smoking.  We will start with physical activities to decrease her stress and progressively reach a point where she can stop smoking.  Doretta, it was a pleasure seeing you today!

## 2020-02-23 ENCOUNTER — Other Ambulatory Visit: Payer: Self-pay

## 2020-02-23 ENCOUNTER — Ambulatory Visit
Admission: RE | Admit: 2020-02-23 | Discharge: 2020-02-23 | Disposition: A | Discharge status: Home / Self Care | Payer: 59 | Source: Ambulatory Visit | Attending: Obstetrics & Gynecology | Admitting: Obstetrics & Gynecology

## 2020-02-23 DIAGNOSIS — Z1231 Encounter for screening mammogram for malignant neoplasm of breast: Secondary | ICD-10-CM

## 2020-06-27 NOTE — Progress Notes (Signed)
Cardiology Office Note:    Date:  06/28/2020   ID:  [name], DOB 21-Jul-1956, MRN [id number]  PCP:  Lajean Manes, MD  Cardiologist:  Sinclair Grooms, MD   Referring MD: Lajean Manes, MD   Chief Complaint  Patient presents with  . Coronary Artery Disease  . Hyperlipidemia  . Hypertension    History of Present Illness:    Sonia Robertson is a 64 y.o. female with a hx of f/u CAD wiith history of PCI on diagonal 1987, Hyperlipidemia, and hypertension.  Chevi is doing okay.  She is relatively and active.  Since the pandemic onset, she has worked from home.  There is some social isolation.  Her husband is at home with her.  She is not as active during the pandemic because going into work caused her to be more physically active for various reasons.  She denies angina, nitroglycerin use, dyspnea on exertion, orthopnea, PND, palpitations, and claudication.  Past Medical History:  Diagnosis Date  . Adopted    pt is adopted  . Angina    NONE SINCE MARCH   SEES DR H Amay Mijangos  -LOV 9'92 Epic.-, 08-11-16"remains with sporadic episodes of chest pain relived with NTG use_ Dr. Tamala Julian aware.  . Arthritis    "thinks arthritis in neck"  . Fibroid   . Hypertension   . Hypothyroidism   . Myocardial infarction ([place])    1988  . Shortness of breath    WITH EXERTION  . Thyroid disease     Past Surgical History:  Procedure Laterality Date  . ABDOMINAL HYSTERECTOMY  2004   TAH BSO  . BACK SURGERY     fusion lumbar  . CARDIAC CATHETERIZATION    . COLONOSCOPY WITH PROPOFOL N/A 08/14/2016   Procedure: COLONOSCOPY WITH PROPOFOL;  Surgeon: Garlan Fair, MD;  Location: [facility];  Service: Endoscopy;  Laterality: N/A;  . KNEE SURGERY     Arthroscopic  . OOPHORECTOMY     BSO    Current Medications: Current Meds  Medication Sig  . amLODipine (NORVASC) 10 MG tablet Take 10 mg by mouth daily.    Marland Kitchen aspirin EC 81 MG tablet Take 1 tablet (81 mg total) by mouth daily.  Marland Kitchen atenolol  (TENORMIN) 25 MG tablet Take 25 mg by mouth daily. Takes 1/2 tablet(12.5 mg) daily AM/ 1/2 tablet (12.5 mg) daily PM = 25 mg daily total  . atorvastatin (LIPITOR) 40 MG tablet Take 1 tablet (40 mg total) by mouth daily.  . Calcium Carbonate-Vit D-Min (CALCIUM 1200 PO) Take 1 tablet by mouth daily.  . cimetidine (TAGAMET) 200 MG tablet Take 200 mg by mouth as needed.   . Coenzyme Q10 (COQ10) 200 MG CAPS Take 1 capsule by mouth at bedtime.   . Cyanocobalamin (VITAMIN B-12 PO) Take 1 tablet by mouth daily.  Marland Kitchen ibuprofen (ADVIL,MOTRIN) 200 MG tablet Take 800 mg by mouth every 8 (eight) hours as needed. Pain/ swelling.  Only if she has not taken Diclofenac.  . isosorbide mononitrate (IMDUR) 60 MG 24 hr tablet Take 1 tablet (60 mg total) by mouth daily.  Marland Kitchen levothyroxine (SYNTHROID, LEVOTHROID) 125 MCG tablet Take 125 mcg by mouth daily before breakfast.  . Multiple Vitamin (MULTIVITAMIN WITH MINERALS) TABS Take 1 tablet by mouth daily.  . nitroGLYCERIN (NITROSTAT) 0.4 MG SL tablet Place 1 tablet (0.4 mg total) under the tongue every 5 (five) minutes as needed for chest pain.  . Vitamin D, Cholecalciferol, 1000 UNITS TABS Take  1 tablet by mouth at bedtime.   . Zinc 15 MG CAPS Take 1 capsule by mouth daily.     Allergies:   Morphine and related   Social History   Socioeconomic History  . Marital status: Married    Spouse name: Not on file  . Number of children: Not on file  . Years of education: Not on file  . Highest education level: Not on file  Occupational History  . Not on file  Tobacco Use  . Smoking status: Current Every Day Smoker    Packs/day: 0.50    Types: Cigarettes  . Smokeless tobacco: Never Used  . Tobacco comment: Chantix is helping decrease use  Vaping Use  . Vaping Use: Never used  Substance and Sexual Activity  . Alcohol use: No    Alcohol/week: 0.0 standard drinks  . Drug use: No  . Sexual activity: Yes    Birth control/protection: Surgical    Comment: 1ST  INTERCOURSE- 13, PARTNERS- GREATER THAN 5  Other Topics Concern  . Not on file  Social History Narrative  . Not on file   Social Determinants of Health   Financial Resource Strain:   . Difficulty of Paying Living Expenses:   Food Insecurity:   . Worried About Charity fundraiser in the Last Year:   . Arboriculturist in the Last Year:   Transportation Needs:   . Film/video editor (Medical):   Marland Kitchen Lack of Transportation (Non-Medical):   Physical Activity:   . Days of Exercise per Week:   . Minutes of Exercise per Session:   Stress:   . Feeling of Stress :   Social Connections:   . Frequency of Communication with Friends and Family:   . Frequency of Social Gatherings with Friends and Family:   . Attends Religious Services:   . Active Member of Clubs or Organizations:   . Attends Archivist Meetings:   Marland Kitchen Marital Status:      Family History: The patient's family history includes Hypertension in her mother. There is no history of Breast cancer. She was adopted.  ROS:   Please see the history of present illness.    Smokes more than a pack a day.  Physically inactive.  Lower back does not give her near the pain that she once had.  This is a result of surgery by Dr. Sherwood Gambler.  She has not needed sublingual nitroglycerin.  She is hesitant to get the vaccine.  All other systems reviewed and are negative.  EKGs/Labs/Other Studies Reviewed:    The following studies were reviewed today: No recent cardiac imaging or ischemic evaluation  Noncoronary chest CT 10/15/2019: IMPRESSION: 1. Lung-RADS 2, benign appearance or behavior. Continue annual screening with low-dose chest CT without contrast in 12 months.  Aortic Atherosclerosis (ICD10-I70.0) and Emphysema (ICD10-J43.9)  EKG:  EKG sinus bradycardia, 54 bpm.  Nonspecific ST abnormality.  PR interval 152 ms.  1 year ago, no significant changes noted.  Recent Labs: No results found for requested labs within last 8760  hours.  Recent Lipid Panel    Component Value Date/Time   CHOL 137 06/23/2019 0936   TRIG 87 06/23/2019 0936   HDL 43 06/23/2019 0936   CHOLHDL 3.2 06/23/2019 0936   CHOLHDL 4 03/10/2015 1718   VLDL 13.6 03/10/2015 1718   LDLCALC 77 06/23/2019 0936   LDLDIRECT 68.6 06/16/2014 0830    Physical Exam:    VS:  BP (!) 140/82 (BP Location: Right  Arm, Patient Position: Sitting, Cuff Size: Normal)   Pulse 54   Ht 5\' 7"  (1.702 m)   Wt 168 lb (76.2 kg)   SpO2 93%   BMI 26.31 kg/m     Wt Readings from Last 3 Encounters:  06/28/20 168 lb (76.2 kg)  01/20/20 169 lb (76.7 kg)  06/23/19 174 lb 12.8 oz (79.3 kg)     GEN: Compatible with age. No acute distress HEENT: Normal NECK: No JVD. LYMPHATICS: No lymphadenopathy CARDIAC:  RRR without murmur, gallop, or edema. VASCULAR:  Normal Pulses. No bruits. RESPIRATORY:  Clear to auscultation without rales, wheezing or rhonchi  ABDOMEN: Soft, non-tender, non-distended, No pulsatile mass, MUSCULOSKELETAL: No deformity  SKIN: Warm and dry NEUROLOGIC:  Alert and oriented x 3 PSYCHIATRIC:  Normal affect   ASSESSMENT:    1. Coronary artery disease involving native coronary artery of native heart with angina pectoris ([place])   2. Other hyperlipidemia   3. Essential hypertension   4. Tobacco use   5. Educated about COVID-19 virus infection    PLAN:    In order of problems listed above:  1. Secondary prevention discussed.  Smoking cessation is encouraged.  Increase physical activity is encouraged.  Liver and lipid panel will be obtained within the next several weeks.  Blood pressure is elevated today but she forgot to take her a.m. medications. 2. LDL target less than 70.  Continue high intensity statin therapy in the form of Lipitor 40 mg/day.  Liver and lipid panel along with a hemoglobin A1c and CBC will be done soon. 3. Blood pressure 140/82.  Target is 130/80 or less.  Did not have amlodipine this morning or a atenolol. 4. Smoking  cessation is encouraged. 5. She is not vaccinated, has not.  Practicing social distancing and isolation.   Because of sedentary lifestyle, stress myocardial perfusion study will be done to rule out progressive CAD, and assess blood pressure control. Overall education and awareness concerning primary/secondary risk prevention was discussed in detail: LDL less than 70, hemoglobin A1c less than 7, blood pressure target less than 130/80 mmHg, >150 minutes of moderate aerobic activity per week, avoidance of smoking, weight control (via diet and exercise), and continued surveillance/management of/for obstructive sleep apnea.    Medication Adjustments/Labs and Tests Ordered: Current medicines are reviewed at length with the patient today.  Concerns regarding medicines are outlined above.  Orders Placed This Encounter  Procedures  . EKG 12-Lead   No orders of the defined types were placed in this encounter.   There are no Patient Instructions on file for this visit.   Signed, Sinclair Grooms, MD  06/28/2020 4:22 PM    [place]

## 2020-06-28 ENCOUNTER — Encounter: Payer: Self-pay | Admitting: *Deleted

## 2020-06-28 ENCOUNTER — Ambulatory Visit (INDEPENDENT_AMBULATORY_CARE_PROVIDER_SITE_OTHER): Payer: 59 | Admitting: Interventional Cardiology

## 2020-06-28 ENCOUNTER — Encounter: Payer: Self-pay | Admitting: Interventional Cardiology

## 2020-06-28 ENCOUNTER — Other Ambulatory Visit: Payer: Self-pay

## 2020-06-28 VITALS — BP 140/82 | HR 54 | Ht 67.0 in | Wt 168.0 lb

## 2020-06-28 DIAGNOSIS — Z7189 Other specified counseling: Secondary | ICD-10-CM

## 2020-06-28 DIAGNOSIS — I25119 Atherosclerotic heart disease of native coronary artery with unspecified angina pectoris: Secondary | ICD-10-CM

## 2020-06-28 DIAGNOSIS — I10 Essential (primary) hypertension: Secondary | ICD-10-CM

## 2020-06-28 DIAGNOSIS — E7849 Other hyperlipidemia: Secondary | ICD-10-CM

## 2020-06-28 DIAGNOSIS — Z720 Tobacco use: Secondary | ICD-10-CM | POA: Diagnosis not present

## 2020-06-28 NOTE — Patient Instructions (Signed)
Medication Instructions:  Your physician recommends that you continue on your current medications as directed. Please refer to the Current Medication list given to you today.  *If you need a refill on your cardiac medications before your next appointment, please call your pharmacy*   Lab Work: Liver, Lipid, BMET, A1C and CBC when you are fasting. (Nothing to eat or drink after midnight except water and black coffee).  If you have labs (blood work) drawn today and your tests are completely normal, you will receive your results only by: Marland Kitchen MyChart Message (if you have MyChart) OR . A paper copy in the mail If you have any lab test that is abnormal or we need to change your treatment, we will call you to review the results.   Testing/Procedures: Your physician has requested that you have an exercise stress myoview anytime before the year is out. For further information please visit [email or web address]. Please follow instruction sheet, as given.     Follow-Up: At [facility], you and your health needs are our priority.  As part of our continuing mission to provide you with exceptional heart care, we have created designated Provider Care Teams.  These Care Teams include your primary Cardiologist (physician) and Advanced Practice Providers (APPs -  Physician Assistants and Nurse Practitioners) who all work together to provide you with the care you need, when you need it.  We recommend signing up for the patient portal called "MyChart".  Sign up information is provided on this After Visit Summary.  MyChart is used to connect with patients for Virtual Visits (Telemedicine).  Patients are able to view lab/test results, encounter notes, upcoming appointments, etc.  Non-urgent messages can be sent to your provider as well.   To learn more about what you can do with MyChart, go to [email or web address].    Your next appointment:   12 month(s)  The format for your next appointment:   In  Person  Provider:   You may see Sinclair Grooms, MD or one of the following Advanced Practice Providers on your designated Care Team:    Truitt Merle, NP  Cecilie Kicks, NP  Kathyrn Drown, NP    Other Instructions

## 2020-06-29 ENCOUNTER — Telehealth (HOSPITAL_COMMUNITY): Payer: Self-pay | Admitting: *Deleted

## 2020-06-29 NOTE — Telephone Encounter (Signed)
Left message on voicemail per DPR in reference to upcoming appointment scheduled on 07/02/20 with detailed instructions given per Myocardial Perfusion Study Information Sheet for the test. LM to arrive 15 minutes early, and that it is imperative to arrive on time for appointment to keep from having the test rescheduled. If you need to cancel or reschedule your appointment, please call the office within 24 hours of your appointment. Failure to do so may result in a cancellation of your appointment, and a $50 no show fee. Phone number given for call back for any questions. [name]

## 2020-07-02 ENCOUNTER — Other Ambulatory Visit: Payer: 59 | Admitting: *Deleted

## 2020-07-02 ENCOUNTER — Ambulatory Visit (HOSPITAL_COMMUNITY): Payer: 59 | Attending: Cardiology

## 2020-07-02 ENCOUNTER — Other Ambulatory Visit: Payer: Self-pay

## 2020-07-02 DIAGNOSIS — E7849 Other hyperlipidemia: Secondary | ICD-10-CM

## 2020-07-02 DIAGNOSIS — I25119 Atherosclerotic heart disease of native coronary artery with unspecified angina pectoris: Secondary | ICD-10-CM

## 2020-07-02 DIAGNOSIS — I10 Essential (primary) hypertension: Secondary | ICD-10-CM

## 2020-07-02 LAB — MYOCARDIAL PERFUSION IMAGING
Estimated workload: 4.6 METS
Exercise duration (min): 4 min
Exercise duration (sec): 31 s
LV dias vol: 90 mL (ref 46–106)
LV sys vol: 37 mL
MPHR: 155 {beats}/min
Peak HR: 157 {beats}/min
Percent HR: 101 %
Rest HR: 70 {beats}/min
SDS: 3
SRS: 2
SSS: 5
TID: 1.06

## 2020-07-02 MED ORDER — TECHNETIUM TC 99M TETROFOSMIN IV KIT
9.5000 | PACK | Freq: Once | INTRAVENOUS | Status: AC | PRN
  Administered 2020-07-02: 9.5 via INTRAVENOUS
  Filled 2020-07-02: qty 10

## 2020-07-02 MED ORDER — TECHNETIUM TC 99M TETROFOSMIN IV KIT
32.5000 | PACK | Freq: Once | INTRAVENOUS | Status: AC | PRN
  Administered 2020-07-02: 32.5 via INTRAVENOUS
  Filled 2020-07-02: qty 33

## 2020-07-03 LAB — CBC
Hematocrit: 43.7 % (ref 34.0–46.6)
Hemoglobin: 14.3 g/dL (ref 11.1–15.9)
MCH: 29.3 pg (ref 26.6–33.0)
MCHC: 32.7 g/dL (ref 31.5–35.7)
MCV: 90 fL (ref 79–97)
Platelets: 155 10*3/uL (ref 150–450)
RBC: 4.88 x10E6/uL (ref 3.77–5.28)
RDW: 12.8 % (ref 11.7–15.4)
WBC: 6.5 10*3/uL (ref 3.4–10.8)

## 2020-07-03 LAB — BASIC METABOLIC PANEL
BUN/Creatinine Ratio: 25 (ref 12–28)
BUN: 16 mg/dL (ref 8–27)
CO2: 23 mmol/L (ref 20–29)
Calcium: 9.3 mg/dL (ref 8.7–10.3)
Chloride: 105 mmol/L (ref 96–106)
Creatinine, Ser: 0.65 mg/dL (ref 0.57–1.00)
GFR calc Af Amer: 108 mL/min/{1.73_m2} (ref >59)
GFR calc non Af Amer: 93 mL/min/{1.73_m2} (ref >59)
Glucose: 98 mg/dL (ref 65–99)
Potassium: 3.9 mmol/L (ref 3.5–5.2)
Sodium: 143 mmol/L (ref 134–144)

## 2020-07-03 LAB — HEPATIC FUNCTION PANEL
ALT: 17 IU/L (ref 0–32)
AST: 15 IU/L (ref 0–40)
Albumin: 4.5 g/dL (ref 3.8–4.8)
Alkaline Phosphatase: 83 IU/L (ref 48–121)
Bilirubin Total: 0.7 mg/dL (ref 0.0–1.2)
Bilirubin, Direct: 0.19 mg/dL (ref 0.00–0.40)
Total Protein: 6.4 g/dL (ref 6.0–8.5)

## 2020-07-03 LAB — LIPID PANEL
Chol/HDL Ratio: 2.8 ratio (ref 0.0–4.4)
Cholesterol, Total: 121 mg/dL (ref 100–199)
HDL: 43 mg/dL (ref >39)
LDL Chol Calc (NIH): 63 mg/dL (ref 0–99)
Triglycerides: 71 mg/dL (ref 0–149)
VLDL Cholesterol Cal: 15 mg/dL (ref 5–40)

## 2020-07-03 LAB — HEMOGLOBIN A1C
Est. average glucose Bld gHb Est-mCnc: 105 mg/dL
Hgb A1c MFr Bld: 5.3 % (ref 4.8–5.6)

## 2020-08-02 ENCOUNTER — Other Ambulatory Visit: Payer: Self-pay | Admitting: Family

## 2020-08-02 DIAGNOSIS — E7849 Other hyperlipidemia: Secondary | ICD-10-CM

## 2020-08-02 DIAGNOSIS — I25119 Atherosclerotic heart disease of native coronary artery with unspecified angina pectoris: Secondary | ICD-10-CM

## 2020-10-11 ENCOUNTER — Ambulatory Visit (INDEPENDENT_AMBULATORY_CARE_PROVIDER_SITE_OTHER): Payer: 59 | Admitting: Podiatry

## 2020-10-11 ENCOUNTER — Encounter: Payer: Self-pay | Admitting: Podiatry

## 2020-10-11 ENCOUNTER — Other Ambulatory Visit: Payer: Self-pay

## 2020-10-11 DIAGNOSIS — M79674 Pain in right toe(s): Secondary | ICD-10-CM | POA: Diagnosis not present

## 2020-10-11 DIAGNOSIS — L600 Ingrowing nail: Secondary | ICD-10-CM

## 2020-10-11 DIAGNOSIS — B351 Tinea unguium: Secondary | ICD-10-CM | POA: Diagnosis not present

## 2020-10-11 DIAGNOSIS — M79675 Pain in left toe(s): Secondary | ICD-10-CM | POA: Diagnosis not present

## 2020-10-12 NOTE — Progress Notes (Signed)
Subjective:   Patient ID: Sonia Robertson, female   DOB: 64 y.o.   MRN: [id number]   HPI Patient states she has had trouble with all her nails especially the second nails of both feet and they get thick and make it hard for her to wear shoe gear comfortably.  She understands removal may be necessary at 1 point in future and wants to discuss.  Patient smokes half pack per day and is active   Review of Systems  All other systems reviewed and are negative.       Objective:  Physical Exam Vitals and nursing note reviewed.  Constitutional:      Appearance: She is well-developed.  Pulmonary:     Effort: Pulmonary effort is normal.  Musculoskeletal:        General: Normal range of motion.  Skin:    General: Skin is warm.  Neurological:     Mental Status: She is alert.     Neurovascular status was found to be intact muscle strength was found to be adequate range of motion within normal limits.  Patient is noted to have damaged subcu bill leftover wrote her all nails to be thickened and difficult for her to cut with discomfort associated with them.  Patient is found to have good digital perfusion well oriented x3      Assessment:  Mycotic nail infection 1-5 both feet with moderate discomfort     Plan:  H&P reviewed condition and I went ahead today and debrided nailbeds 1-5 both feet and then discussed the possibility for nail removal educating her on this.  Patient will consider this and will get a hold off currently and may be necessary at 1 point in future and I educated her on permanent nail removal and what is required with this

## 2020-10-13 ENCOUNTER — Other Ambulatory Visit: Payer: Self-pay | Admitting: Geriatric Medicine

## 2020-10-13 DIAGNOSIS — F17200 Nicotine dependence, unspecified, uncomplicated: Secondary | ICD-10-CM

## 2020-10-16 ENCOUNTER — Other Ambulatory Visit: Payer: Self-pay | Admitting: Interventional Cardiology

## 2020-10-16 DIAGNOSIS — I25119 Atherosclerotic heart disease of native coronary artery with unspecified angina pectoris: Secondary | ICD-10-CM

## 2020-10-16 DIAGNOSIS — I10 Essential (primary) hypertension: Secondary | ICD-10-CM

## 2020-10-19 ENCOUNTER — Telehealth: Payer: Self-pay | Admitting: Interventional Cardiology

## 2020-10-19 NOTE — Telephone Encounter (Signed)
*  STAT* If patient is at the pharmacy, call can be transferred to refill team.   1. Which medications need to be refilled? (please list name of each medication and dose if known) isosorbide mononitrate (IMDUR) 60 MG 24 hr tablet  2. Which pharmacy/location (including street and city if local pharmacy) is medication to be sent to? [id number] - [place], [place] - [address]  3. Do they need a 30 day or 90 day supply? 90 day   Patient is out of medication

## 2020-10-19 NOTE — Telephone Encounter (Signed)
Called pt to inform her that her medication was sent to her pharmacy on yesterday. Pt stated that pharmacy said that they did not receive it. I called the pharmacy and gave a verbal order over the phone, for isosorbide mononitrate 60 mg 24 hr tablet, dispensing 90 tablets with 3 refills. Pharmacist verbalized understanding.

## 2020-11-02 ENCOUNTER — Ambulatory Visit
Admission: RE | Admit: 2020-11-02 | Discharge: 2020-11-02 | Disposition: A | Discharge status: Home / Self Care | Payer: 59 | Source: Ambulatory Visit | Attending: Geriatric Medicine | Admitting: Geriatric Medicine

## 2020-11-02 DIAGNOSIS — F17200 Nicotine dependence, unspecified, uncomplicated: Secondary | ICD-10-CM

## 2020-12-28 ENCOUNTER — Telehealth: Payer: Self-pay | Admitting: Interventional Cardiology

## 2020-12-28 DIAGNOSIS — I10 Essential (primary) hypertension: Secondary | ICD-10-CM

## 2020-12-28 DIAGNOSIS — I25119 Atherosclerotic heart disease of native coronary artery with unspecified angina pectoris: Secondary | ICD-10-CM

## 2020-12-28 MED ORDER — NITROGLYCERIN 0.4 MG SL SUBL: 0.4000 mg | SUBLINGUAL_TABLET | SUBLINGUAL | 4 refills | Status: DC | PRN

## 2020-12-28 MED ORDER — ISOSORBIDE MONONITRATE ER 60 MG PO TB24: 60.0000 mg | ORAL_TABLET | Freq: Every day | ORAL | 1 refills | Status: DC

## 2020-12-28 NOTE — Telephone Encounter (Signed)
Pt's medications were sent to pt's pharmacy as requested. Confirmation received.  

## 2020-12-28 NOTE — Telephone Encounter (Signed)
*  STAT* If patient is at the pharmacy, call can be transferred to refill team.   1. Which medications need to be refilled? (please list name of each medication and dose if known)  isosorbide mononitrate (IMDUR) 60 MG 24 hr tablet nitroGLYCERIN (NITROSTAT) 0.4 MG SL tablet    2. Which pharmacy/location (including street and city if local pharmacy) is medication to be sent to? [facility] [address], [place], [place] [address]  3. Do they need a 30 day or 90 day supply? [place]

## 2021-01-20 ENCOUNTER — Ambulatory Visit (INDEPENDENT_AMBULATORY_CARE_PROVIDER_SITE_OTHER): Payer: Medicare Other | Admitting: Obstetrics & Gynecology

## 2021-01-20 ENCOUNTER — Encounter: Payer: Self-pay | Admitting: Obstetrics & Gynecology

## 2021-01-20 ENCOUNTER — Other Ambulatory Visit: Payer: Self-pay

## 2021-01-20 VITALS — BP 140/80 | Ht 66.0 in | Wt 157.0 lb

## 2021-01-20 DIAGNOSIS — Z780 Asymptomatic menopausal state: Secondary | ICD-10-CM | POA: Diagnosis not present

## 2021-01-20 DIAGNOSIS — Z90710 Acquired absence of both cervix and uterus: Secondary | ICD-10-CM

## 2021-01-20 DIAGNOSIS — Z9189 Other specified personal risk factors, not elsewhere classified: Secondary | ICD-10-CM

## 2021-01-20 DIAGNOSIS — F17210 Nicotine dependence, cigarettes, uncomplicated: Secondary | ICD-10-CM | POA: Diagnosis not present

## 2021-01-20 DIAGNOSIS — Z01419 Encounter for gynecological examination (general) (routine) without abnormal findings: Secondary | ICD-10-CM

## 2021-01-20 DIAGNOSIS — Z9079 Acquired absence of other genital organ(s): Secondary | ICD-10-CM

## 2021-01-20 DIAGNOSIS — Z90722 Acquired absence of ovaries, bilateral: Secondary | ICD-10-CM

## 2021-01-20 NOTE — Progress Notes (Addendum)
Sonia Robertson 1956/11/28 [id number]   History:    65 y.o.  G3P2A1L2 Married.  Retired x 12/2020.   RP:  Established patient presenting for annual gyn exam    HPI: S/P TAH/BSO.  Postmenopause, on no HRT x 3 yrs.  No pain with intercourse.  Urine and bowel movements normal.  Breasts normal.  Body mass index improved to 25.34 this year.  Not exercising regularly.  Still smoking 1 ppd.  Health labs with family physician.  BD 02/2016 normal.   Past medical history,surgical history, family history and social history were all reviewed and documented in the EPIC chart.  Gynecologic History No LMP recorded. Patient has had a hysterectomy.  Obstetric History OB History  Gravida Para Term Preterm AB Living  3 2 2   1 2   SAB IAB Ectopic Multiple Live Births               # Outcome Date GA Lbr Len/2nd Weight Sex Delivery Anes PTL Lv  3 AB           2 Term           1 Term              ROS: A ROS was performed and pertinent positives and negatives are included in the history.  GENERAL: No fevers or chills. HEENT: No change in vision, no earache, sore throat or sinus congestion. NECK: No pain or stiffness. CARDIOVASCULAR: No chest pain or pressure. No palpitations. PULMONARY: No shortness of breath, cough or wheeze. GASTROINTESTINAL: No abdominal pain, nausea, vomiting or diarrhea, melena or bright red blood per rectum. GENITOURINARY: No urinary frequency, urgency, hesitancy or dysuria. MUSCULOSKELETAL: No joint or muscle pain, no back pain, no recent trauma. DERMATOLOGIC: No rash, no itching, no lesions. ENDOCRINE: No polyuria, polydipsia, no heat or cold intolerance. No recent change in weight. HEMATOLOGICAL: No anemia or easy bruising or bleeding. NEUROLOGIC: No headache, seizures, numbness, tingling or weakness. PSYCHIATRIC: No depression, no loss of interest in normal activity or change in sleep pattern.     Exam:   BP 140/80   Ht 5\' 6"  (1.676 m)   Wt 157 lb (71.2 kg)   BMI 25.34 kg/m    Body mass index is 25.34 kg/m.  General appearance : Well developed well nourished female. No acute distress HEENT: Eyes: no retinal hemorrhage or exudates,  Neck supple, trachea midline, no carotid bruits, no thyroidmegaly Lungs: Clear to auscultation, no rhonchi or wheezes, or rib retractions  Heart: Regular rate and rhythm, no murmurs or gallops Breast:Examined in sitting and supine position were symmetrical in appearance, no palpable masses or tenderness,  no skin retraction, no nipple inversion, no nipple discharge, no skin discoloration, no axillary or supraclavicular lymphadenopathy Abdomen: no palpable masses or tenderness, no rebound or guarding Extremities: no edema or skin discoloration or tenderness  Pelvic: Vulva: Normal             Vagina: No gross lesions or discharge  Cervix/Uterus absent  Adnexa  Without masses or tenderness  Anus: Normal   Assessment/Plan:  65 y.o. female for annual exam   1. Well female exam with routine gynecological exam Gynecologic exam status post TAH/BSO.  No indication to repeat a Pap test at this time.  Breast exam normal. Screening mammogram March 2021 was negative.  Colonoscopy 2017.  Health labs with family physician.  Good body mass index at 25.34.  Continue with fitness and healthy nutrition.  2. At risk  for infection First sexual activity at age 62.  >5 sexual partners.  3. S/P TAH-BSO  4. Postmenopausal Well on no hormone replacement therapy.  No postmenopausal bleeding.  Bone density was normal in 2017.  5. Cigarette smoker Counseling on smoking cessation done.  Patient strongly encouraged to quit smoking by progressively decreasing the number of cigarettes per day.  Princess Bruins MD, 11:49 AM 01/20/2021

## 2021-01-26 ENCOUNTER — Other Ambulatory Visit: Payer: Self-pay | Admitting: Obstetrics & Gynecology

## 2021-01-26 ENCOUNTER — Telehealth: Payer: Self-pay | Admitting: Interventional Cardiology

## 2021-01-26 DIAGNOSIS — Z1231 Encounter for screening mammogram for malignant neoplasm of breast: Secondary | ICD-10-CM

## 2021-01-26 NOTE — Telephone Encounter (Signed)
For 1.5-2 weeks pt has noticed a heaviness around her collar bones.  Denies CP, SOB, lightheadedness, dizziness, N/V or diaphoresis.  Heaviness comes and goes but has become more consistent the last few days.  Has taken Nitro with minimal relief.  Noticed that when she lays down, the heaviness improves usually but last night it did not.  No vitals available.  Nothing makes it better or worse.  Denies any heavy lifting, pulling, pushing or straining prior to symptoms starting.  Scheduled pt to see Kerin Ransom, PA-C on 3/1 (first available appt).  Advised I will send to Dr. Tamala Julian for review and I would call back if any further recommendations.  Pt appreciative for call.

## 2021-01-26 NOTE — Telephone Encounter (Signed)
There are atypical features. NTG does not help, That goes against CAD/angina. Needs ECG and agree with the OV.

## 2021-01-26 NOTE — Telephone Encounter (Signed)
Patient states for the past 1.5 weeks she has been experiencing heaviness in her chest, around her collar bone. She denies chest pain and states she is not having additional symptoms. Initially the heaviness was coming and going, but not it is constant. Please return call to discus further.

## 2021-01-28 ENCOUNTER — Ambulatory Visit (INDEPENDENT_AMBULATORY_CARE_PROVIDER_SITE_OTHER): Payer: 59 | Admitting: Interventional Cardiology

## 2021-01-28 ENCOUNTER — Other Ambulatory Visit: Payer: Self-pay

## 2021-01-28 ENCOUNTER — Encounter: Payer: Self-pay | Admitting: Interventional Cardiology

## 2021-01-28 VITALS — BP 142/82 | HR 55 | Ht 66.0 in | Wt 162.2 lb

## 2021-01-28 DIAGNOSIS — E7849 Other hyperlipidemia: Secondary | ICD-10-CM

## 2021-01-28 DIAGNOSIS — I10 Essential (primary) hypertension: Secondary | ICD-10-CM

## 2021-01-28 DIAGNOSIS — R072 Precordial pain: Secondary | ICD-10-CM

## 2021-01-28 DIAGNOSIS — I25119 Atherosclerotic heart disease of native coronary artery with unspecified angina pectoris: Secondary | ICD-10-CM | POA: Diagnosis not present

## 2021-01-28 DIAGNOSIS — Z7189 Other specified counseling: Secondary | ICD-10-CM

## 2021-01-28 DIAGNOSIS — Z720 Tobacco use: Secondary | ICD-10-CM

## 2021-01-28 NOTE — Patient Instructions (Addendum)
Medication Instructions:  Your physician recommends that you continue on your current medications as directed. Please refer to the Current Medication list given to you today.  *If you need a refill on your cardiac medications before your next appointment, please call your pharmacy*   Lab Work: BMET prior to your CT  If you have labs (blood work) drawn today and your tests are completely normal, you will receive your results only by: Marland Kitchen MyChart Message (if you have MyChart) OR . A paper copy in the mail If you have any lab test that is abnormal or we need to change your treatment, we will call you to review the results.   Testing/Procedures: Your physician recommends that you have a Coronary CT performed.   Follow-Up:  Will depend on test results   Other Instructions  Your cardiac CT will be scheduled at one of the below locations:   [facility] [address] [place], [place] [address] [phone]  [facility] [address] [place], [place] [address] [phone]  If scheduled at [facility], please arrive at the [facility] main entrance (entrance A) of [facility] 30 minutes prior to test start time. Proceed to the [facility] Radiology Department (first floor) to check-in and test prep.  If scheduled at [facility], please arrive 15 mins early for check-in and test prep.  Please follow these instructions carefully (unless otherwise directed):  Hold all erectile dysfunction medications at least 3 days (72 hrs) prior to test.  On the Night Before the Test: . Be sure to Drink plenty of water. . Do not consume any caffeinated/decaffeinated beverages or chocolate 12 hours prior to your test. . Do not take any antihistamines 12 hours prior to your test.   On the Day of the Test: . Drink plenty of water until 1 hour prior to the test. . Do not eat any food 4  hours prior to the test. . You may take your regular medications prior to the test.  . HOLD Furosemide/Hydrochlorothiazide morning of the test. . FEMALES- please wear underwire-free bra if available       After the Test: . Drink plenty of water. . After receiving IV contrast, you may experience a mild flushed feeling. This is normal. . On occasion, you may experience a mild rash up to 24 hours after the test. This is not dangerous. If this occurs, you can take Benadryl 25 mg and increase your fluid intake. . If you experience trouble breathing, this can be serious. If it is severe call 911 IMMEDIATELY. If it is mild, please call our office. . If you take any of these medications: Glipizide/Metformin, Avandament, Glucavance, please do not take 48 hours after completing test unless otherwise instructed.   Once we have confirmed authorization from your insurance company, we will call you to set up a date and time for your test. Based on how quickly your insurance processes prior authorizations requests, please allow up to 4 weeks to be contacted for scheduling your Cardiac CT appointment. Be advised that routine Cardiac CT appointments could be scheduled as many as 8 weeks after your provider has ordered it.  For non-scheduling related questions, please contact the cardiac imaging nurse navigator should you have any questions/concerns: Marchia Bond, Cardiac Imaging Nurse Navigator Gordy Clement, Cardiac Imaging Nurse Navigator [facility] Direct Office Dial: [phone]   For scheduling needs, including cancellations and  rescheduling, please call [place], [phone].

## 2021-01-28 NOTE — Progress Notes (Signed)
Cardiology Office Note:    Date:  01/28/2021   ID:  Sonia Robertson, DOB [birth year], MRN [id number]  PCP:  Lajean Manes, MD  Cardiologist:  Sinclair Grooms, MD   Referring MD: Lajean Manes, MD   Chief Complaint  Patient presents with  . Coronary Artery Disease  . Chest Pain    History of Present Illness:    Sonia Robertson is a 65 y.o. female with a hx of CADwiith history of PCI ondiagonal1987, Hyperlipidemia, and hypertension.  For 3 weeks Sonia Robertson has been having near continuous bilateral medial collarbone discomfort and some sensation of burning in her throat that Sonia Robertson feels may be reflux.  Physical activity does not aggravate the discomfort.  Sublingual nitroglycerin has not relieved the discomfort.  Risk factors include prior acute ischemic event 35 years ago, aortic atherosclerosis has been noted on chest CT imaging, hypertension, smoking history, and hyperlipidemia.  Sonia Robertson had a nonischemic nuclear stress test 8 months ago.  Past Medical History:  Diagnosis Date  . Adopted    pt is adopted  . Angina    NONE SINCE MARCH   SEES DR H SMITH  -LOV 9'24 Epic.-, 08-11-16"remains with sporadic episodes of chest pain relived with NTG use_ Dr. Tamala Julian aware.  . Arthritis    "thinks arthritis in neck"  . Fibroid   . Hypertension   . Hypothyroidism   . Myocardial infarction ([place])    1988  . Shortness of breath    WITH EXERTION  . Thyroid disease     Past Surgical History:  Procedure Laterality Date  . ABDOMINAL HYSTERECTOMY  2004   TAH BSO  . BACK SURGERY     fusion lumbar  . CARDIAC CATHETERIZATION    . COLONOSCOPY WITH PROPOFOL N/A 08/14/2016   Procedure: COLONOSCOPY WITH PROPOFOL;  Surgeon: Garlan Fair, MD;  Location: [facility];  Service: Endoscopy;  Laterality: N/A;  . KNEE SURGERY     Arthroscopic  . OOPHORECTOMY     BSO    Current Medications: Current Meds  Medication Sig  . amLODipine (NORVASC) 10 MG tablet Take 10 mg by mouth daily.  Marland Kitchen aspirin EC 81  MG tablet Take 1 tablet (81 mg total) by mouth daily.  Marland Kitchen atenolol (TENORMIN) 25 MG tablet Take 25 mg by mouth daily. Takes 1/2 tablet(12.5 mg) daily AM/ 1/2 tablet (12.5 mg) daily PM = 25 mg daily total  . atorvastatin (LIPITOR) 40 MG tablet TAKE 1 TABLET BY MOUTH EVERY DAY  . Calcium Carbonate-Vit D-Min (CALCIUM 1200 PO) Take 1 tablet by mouth daily.  . cimetidine (TAGAMET) 200 MG tablet Take 200 mg by mouth as needed.   . Coenzyme Q10 (COQ10) 200 MG CAPS Take 1 capsule by mouth at bedtime.   . Cyanocobalamin (VITAMIN B-12 PO) Take 1 tablet by mouth daily.  Marland Kitchen ibuprofen (ADVIL,MOTRIN) 200 MG tablet Take 800 mg by mouth every 8 (eight) hours as needed. Pain/ swelling.  Only if Sonia Robertson has not taken Diclofenac.  . isosorbide mononitrate (IMDUR) 60 MG 24 hr tablet Take 1 tablet (60 mg total) by mouth daily.  Marland Kitchen levothyroxine (SYNTHROID, LEVOTHROID) 125 MCG tablet Take 125 mcg by mouth daily before breakfast.  . Multiple Vitamin (MULTIVITAMIN WITH MINERALS) TABS Take 1 tablet by mouth daily.  . nitroGLYCERIN (NITROSTAT) 0.4 MG SL tablet Place 1 tablet (0.4 mg total) under the tongue every 5 (five) minutes as needed for chest pain.  . Vitamin D, Cholecalciferol, 1000 UNITS TABS Take 1 tablet  by mouth at bedtime.   . Zinc 15 MG CAPS Take 1 capsule by mouth daily.     Allergies:   Morphine and related   Social History   Socioeconomic History  . Marital status: Married    Spouse name: Not on file  . Number of children: Not on file  . Years of education: Not on file  . Highest education level: Not on file  Occupational History  . Not on file  Tobacco Use  . Smoking status: Current Every Day Smoker    Packs/day: 0.50    Types: Cigarettes  . Smokeless tobacco: Never Used  . Tobacco comment: Chantix is helping decrease use  Vaping Use  . Vaping Use: Never used  Substance and Sexual Activity  . Alcohol use: No    Alcohol/week: 0.0 standard drinks  . Drug use: No  . Sexual activity: Yes     Birth control/protection: Surgical    Comment: 1ST INTERCOURSE- 13, PARTNERS- GREATER THAN 5  Other Topics Concern  . Not on file  Social History Narrative  . Not on file   Social Determinants of Health   Financial Resource Strain: Not on file  Food Insecurity: Not on file  Transportation Needs: Not on file  Physical Activity: Not on file  Stress: Not on file  Social Connections: Not on file     Family History: The patient's family history includes Hypertension in her mother. There is no history of Breast cancer. Sonia Robertson was adopted.  ROS:   Please see the history of present illness.    Otherwise doing okay.  Appetite is been stable.  All other systems reviewed and are negative.  EKGs/Labs/Other Studies Reviewed:    The following studies were reviewed today: LDL cholesterol 59 in September 2021  EKG:  EKG sinus bradycardia with nonspecific ST abnormality inferior and anterolateral.  When compared to July 2021, no significant changes noted.  Recent Labs: 07/02/2020: ALT 17; BUN 16; Creatinine, Ser 0.65; Hemoglobin 14.3; Platelets 155; Potassium 3.9; Sodium 143  Recent Lipid Panel    Component Value Date/Time   CHOL 121 07/02/2020 0739   TRIG 71 07/02/2020 0739   HDL 43 07/02/2020 0739   CHOLHDL 2.8 07/02/2020 0739   CHOLHDL 4 03/10/2015 1718   VLDL 13.6 03/10/2015 1718   LDLCALC 63 07/02/2020 0739   LDLDIRECT 68.6 06/16/2014 0830    Physical Exam:    VS:  BP (!) 142/82   Pulse (!) 55   Ht 5\' 6"  (1.676 m)   Wt 162 lb 3.2 oz (73.6 kg)   SpO2 98%   BMI 26.18 kg/m     Wt Readings from Last 3 Encounters:  01/28/21 162 lb 3.2 oz (73.6 kg)  01/20/21 157 lb (71.2 kg)  07/02/20 168 lb (76.2 kg)     GEN: Smells of cigarette smoke1. No acute distress HEENT: Normal NECK: No JVD. LYMPHATICS: No lymphadenopathy CARDIAC: Faint right upper sternal systolic murmur. RRR no gallop, or edema. VASCULAR:  Normal Pulses. No bruits. RESPIRATORY:  Clear to auscultation without  rales, wheezing or rhonchi  ABDOMEN: Soft, non-tender, non-distended, No pulsatile mass, MUSCULOSKELETAL: No deformity  SKIN: Warm and dry NEUROLOGIC:  Alert and oriented x 3 PSYCHIATRIC:  Normal affect   ASSESSMENT:    1. Coronary artery disease involving native coronary artery of native heart with angina pectoris ([place])   2. Essential hypertension   3. Other hyperlipidemia   4. Tobacco use   5. Educated about COVID-19 virus infection  6. Precordial pain    PLAN:    In order of problems listed above:  1. Chest discomfort has typical and atypical features.  Diagonal related MI in 1987 was with chest and throat burning sensation.  Sonia Robertson has some similar quality.  Sonia Robertson has bilateral clavicular discomfort that has been present off and on since 8 February.  Not aggravated by physical activity and not relieved by nitro.  Sonia Robertson has multiple risk factors including blood pressure, lipids, tobacco abuse, and prior cardiac event.  Plan coronary CTA. 2. Control blood pressure with current therapy. 3. LDL target is being achieved on statin therapy. 4. Encourage smoking cessation  Overall education and awareness concerning secondary risk prevention was discussed in detail: LDL less than 70, hemoglobin A1c less than 7, blood pressure target less than 130/80 mmHg, >150 minutes of moderate aerobic activity per week, avoidance of smoking, weight control (via diet and exercise), and continued surveillance/management of/for obstructive sleep apnea.    Medication Adjustments/Labs and Tests Ordered: Current medicines are reviewed at length with the patient today.  Concerns regarding medicines are outlined above.  Orders Placed This Encounter  Procedures  . CT CORONARY MORPH W/CTA COR W/SCORE W/CA W/CM &/OR WO/CM  . Basic metabolic panel  . EKG 12-Lead   No orders of the defined types were placed in this encounter.   There are no Patient Instructions on file for this visit.   Signed, Sinclair Grooms, MD  01/28/2021 5:06 PM    [facility]

## 2021-01-29 ENCOUNTER — Encounter: Payer: Self-pay | Admitting: Obstetrics & Gynecology

## 2021-02-01 ENCOUNTER — Ambulatory Visit: Payer: 59 | Admitting: Cardiology

## 2021-02-04 ENCOUNTER — Other Ambulatory Visit: Payer: Medicare Other | Admitting: *Deleted

## 2021-02-04 ENCOUNTER — Other Ambulatory Visit: Payer: Self-pay

## 2021-02-04 DIAGNOSIS — R072 Precordial pain: Secondary | ICD-10-CM

## 2021-02-04 LAB — BASIC METABOLIC PANEL
BUN/Creatinine Ratio: 19 (ref 12–28)
BUN: 13 mg/dL (ref 8–27)
CO2: 24 mmol/L (ref 20–29)
Calcium: 9.5 mg/dL (ref 8.7–10.3)
Chloride: 103 mmol/L (ref 96–106)
Creatinine, Ser: 0.68 mg/dL (ref 0.57–1.00)
Glucose: 96 mg/dL (ref 65–99)
Potassium: 3.9 mmol/L (ref 3.5–5.2)
Sodium: 141 mmol/L (ref 134–144)
eGFR: 96 mL/min/{1.73_m2} (ref >59)

## 2021-02-09 ENCOUNTER — Telehealth (HOSPITAL_COMMUNITY): Payer: Self-pay | Admitting: *Deleted

## 2021-02-09 NOTE — Telephone Encounter (Signed)
Reaching out to patient to offer assistance regarding upcoming cardiac imaging study; pt verbalizes understanding of appt date/time, parking situation and where to check in, pre-test NPO status, and verified current allergies; name and call back number provided for further questions should they arise  Merle Prescott RN Navigator Cardiac Imaging [facility] [phone] office [phone] cell  

## 2021-02-11 ENCOUNTER — Other Ambulatory Visit: Payer: Self-pay

## 2021-02-11 ENCOUNTER — Ambulatory Visit (HOSPITAL_COMMUNITY)
Admission: RE | Admit: 2021-02-11 | Discharge: 2021-02-11 | Disposition: A | Discharge status: Home / Self Care | Payer: Medicare Other | Source: Ambulatory Visit | Attending: Interventional Cardiology | Admitting: Interventional Cardiology

## 2021-02-11 DIAGNOSIS — R072 Precordial pain: Secondary | ICD-10-CM | POA: Diagnosis present

## 2021-02-11 MED ORDER — IOHEXOL 350 MG/ML SOLN
80.0000 mL | Freq: Once | INTRAVENOUS | Status: AC | PRN
  Administered 2021-02-11: 80 mL via INTRAVENOUS

## 2021-02-11 MED ORDER — NITROGLYCERIN 0.4 MG SL SUBL
0.8000 mg | SUBLINGUAL_TABLET | Freq: Once | SUBLINGUAL | Status: AC
  Administered 2021-02-11: 0.8 mg via SUBLINGUAL

## 2021-02-11 MED ORDER — NITROGLYCERIN 0.4 MG SL SUBL
SUBLINGUAL_TABLET | SUBLINGUAL | Status: AC
  Filled 2021-02-11: qty 2

## 2021-03-17 ENCOUNTER — Ambulatory Visit
Admission: RE | Admit: 2021-03-17 | Discharge: 2021-03-17 | Disposition: A | Discharge status: Home / Self Care | Payer: Medicare Other | Source: Ambulatory Visit | Attending: Obstetrics & Gynecology | Admitting: Obstetrics & Gynecology

## 2021-03-17 ENCOUNTER — Other Ambulatory Visit: Payer: Self-pay

## 2021-03-17 ENCOUNTER — Ambulatory Visit: Payer: 59

## 2021-03-17 DIAGNOSIS — Z1231 Encounter for screening mammogram for malignant neoplasm of breast: Secondary | ICD-10-CM

## 2021-06-23 ENCOUNTER — Other Ambulatory Visit: Payer: Self-pay | Admitting: *Deleted

## 2021-06-23 DIAGNOSIS — I10 Essential (primary) hypertension: Secondary | ICD-10-CM

## 2021-06-23 DIAGNOSIS — I25119 Atherosclerotic heart disease of native coronary artery with unspecified angina pectoris: Secondary | ICD-10-CM

## 2021-06-23 MED ORDER — ISOSORBIDE MONONITRATE ER 60 MG PO TB24: 60.0000 mg | ORAL_TABLET | Freq: Every day | ORAL | 1 refills | Status: DC

## 2021-07-31 NOTE — Progress Notes (Signed)
Cardiology Office Note:    Date:  08/01/2021   ID:  Sonia Robertson, DOB 12/07/1955, MRN [id number]  PCP:  Lajean Manes, MD  Cardiologist:  Sinclair Grooms, MD   Referring MD: Lajean Manes, MD   Chief Complaint  Patient presents with   Coronary Artery Disease   Chest Pain    History of Present Illness:    Sonia Robertson is a 65 y.o. female with a hx of CAD wiith history of PCI on diagonal 1987, Hyperlipidemia, and hypertension    Retired in January.  Not exercising.  Experiencing some stress related to this stage of her life.  No recurrence of chest pain.  Bilateral shoulder pain noticed at night when she sleeps on either the left or the right side.  It goes away with changing positions.  There is no exertional component.  Still smoking more than a pack of cigarettes per day.  Worse since she retired.  No sublingual nitroglycerin use.   Past Medical History:  Diagnosis Date   Adopted    pt is adopted   Angina    NONE SINCE MARCH   SEES DR H Tyaisha Cullom  -[place].-, 08-11-16"remains with sporadic episodes of chest pain relived with NTG use_ Dr. Tamala Julian aware.   Arthritis    "thinks arthritis in neck"   Fibroid    Hypertension    Hypothyroidism    Myocardial infarction ([place])    1988   Shortness of breath    WITH EXERTION   Thyroid disease     Past Surgical History:  Procedure Laterality Date   ABDOMINAL HYSTERECTOMY  2004   TAH BSO   BACK SURGERY     fusion lumbar   CARDIAC CATHETERIZATION     COLONOSCOPY WITH PROPOFOL N/A 08/14/2016   Procedure: COLONOSCOPY WITH PROPOFOL;  Surgeon: Garlan Fair, MD;  Location: [facility];  Service: Endoscopy;  Laterality: N/A;   KNEE SURGERY     Arthroscopic   OOPHORECTOMY     BSO    Current Medications: Current Meds  Medication Sig   amLODipine (NORVASC) 10 MG tablet Take 10 mg by mouth daily.   aspirin EC 81 MG tablet Take 1 tablet (81 mg total) by mouth daily.   atenolol (TENORMIN) 25 MG tablet Take 25 mg by  mouth daily. Takes 1/2 tablet(12.5 mg) daily AM/ 1/2 tablet (12.5 mg) daily PM = 25 mg daily total   atorvastatin (LIPITOR) 40 MG tablet TAKE 1 TABLET BY MOUTH EVERY DAY   Calcium Carbonate-Vit D-Min (CALCIUM 1200 PO) Take 1 tablet by mouth daily.   cimetidine (TAGAMET) 200 MG tablet Take 200 mg by mouth as needed.    Coenzyme Q10 (COQ10) 200 MG CAPS Take 1 capsule by mouth at bedtime.    ibuprofen (ADVIL,MOTRIN) 200 MG tablet Take 800 mg by mouth every 8 (eight) hours as needed. Pain/ swelling.  Only if she has not taken Diclofenac.   isosorbide mononitrate (IMDUR) 60 MG 24 hr tablet Take 1 tablet (60 mg total) by mouth daily.   levothyroxine (SYNTHROID, LEVOTHROID) 125 MCG tablet Take 125 mcg by mouth daily before breakfast.   Multiple Vitamin (MULTIVITAMIN WITH MINERALS) TABS Take 1 tablet by mouth daily.   nitroGLYCERIN (NITROSTAT) 0.4 MG SL tablet Place 1 tablet (0.4 mg total) under the tongue every 5 (five) minutes as needed for chest pain.   Vitamin D, Cholecalciferol, 1000 UNITS TABS Take 1 tablet by mouth at bedtime.    Zinc 15 MG  CAPS Take 1 capsule by mouth daily.     Allergies:   Morphine and related   Social History   Socioeconomic History   Marital status: Married    Spouse name: Not on file   Number of children: Not on file   Years of education: Not on file   Highest education level: Not on file  Occupational History   Not on file  Tobacco Use   Smoking status: Every Day    Packs/day: 0.50    Types: Cigarettes   Smokeless tobacco: Never   Tobacco comments:    Chantix is helping decrease use  Vaping Use   Vaping Use: Never used  Substance and Sexual Activity   Alcohol use: No    Alcohol/week: 0.0 standard drinks   Drug use: No   Sexual activity: Yes    Birth control/protection: Surgical    Comment: 1ST INTERCOURSE- 13, PARTNERS- GREATER THAN 5  Other Topics Concern   Not on file  Social History Narrative   Not on file   Social Determinants of Health    Financial Resource Strain: Not on file  Food Insecurity: Not on file  Transportation Needs: Not on file  Physical Activity: Not on file  Stress: Not on file  Social Connections: Not on file     Family History: The patient's family history includes Hypertension in her mother. There is no history of Breast cancer. She was adopted.  ROS:   Please see the history of present illness.    Feels stressed related to finances and overall economic conditions in the country.  Feels limited in her ability to do the things she wants to do.  Left knee prevents physical activity.  Smoking more rather than less.  All other systems reviewed and are negative.  EKGs/Labs/Other Studies Reviewed:    The following studies were reviewed today: Coronary CT angio 02/2021: IMPRESSION: 1. Mild CAD, CADRADS = 2.  Mild CAD in mid LAD and OM2.   2. Coronary calcium score is 53, which places the patient in the 72nd percentile for age and sex matched control.   3. Normal coronary origin with right dominance.   4. Mild dilation of main pulmonary artery, 32 mm, may indicate pulmonary hypertension.    EKG:  EKG not repeated  Recent Labs: 02/04/2021: BUN 13; Creatinine, Ser 0.68; Potassium 3.9; Sodium 141  Recent Lipid Panel    Component Value Date/Time   CHOL 121 07/02/2020 0739   TRIG 71 07/02/2020 0739   HDL 43 07/02/2020 0739   CHOLHDL 2.8 07/02/2020 0739   CHOLHDL 4 03/10/2015 1718   VLDL 13.6 03/10/2015 1718   LDLCALC 63 07/02/2020 0739   LDLDIRECT 68.6 06/16/2014 0830    Physical Exam:    VS:  BP 124/80   Pulse 63   Ht '5\' 6"'$  (1.676 m)   Wt 163 lb (73.9 kg)   SpO2 98%   BMI 26.31 kg/m     Wt Readings from Last 3 Encounters:  08/01/21 163 lb (73.9 kg)  01/28/21 162 lb 3.2 oz (73.6 kg)  01/20/21 157 lb (71.2 kg)     GEN: Compatible with age. No acute distress HEENT: Normal NECK: No JVD. LYMPHATICS: No lymphadenopathy CARDIAC: No murmur. RRR no gallop, or edema. VASCULAR:  Normal  Pulses. No bruits. RESPIRATORY:  Clear to auscultation without rales, wheezing or rhonchi  ABDOMEN: Soft, non-tender, non-distended, No pulsatile mass, MUSCULOSKELETAL: No deformity  SKIN: Warm and dry NEUROLOGIC:  Alert and oriented x 3 PSYCHIATRIC:  Normal affect   ASSESSMENT:    1. Coronary artery disease involving native coronary artery of native heart with angina pectoris ([place])   2. Essential hypertension   3. Other hyperlipidemia   4. Tobacco use    PLAN:    In order of problems listed above:  Smoking cessation encouraged.  Continue management of other risk factors. Excellent blood pressure control.  Smoking cessation, low-salt diet, continue Tenormin and Norvasc. Continue Lipitor 40 mg/day.  Last LDL was 59 in September 2021.  Has an upcoming appointment with Dr. Felipa Eth. We discussed smoking cessation.  She cannot commit to a stop plan.     Overall education and awareness concerning primary/secondary risk prevention was discussed in detail: LDL less than 70, hemoglobin A1c less than 7, blood pressure target less than 130/80 mmHg, >150 minutes of moderate aerobic activity per week, avoidance of smoking, weight control (via diet and exercise), and continued surveillance/management of/for obstructive sleep apnea.    Medication Adjustments/Labs and Tests Ordered: Current medicines are reviewed at length with the patient today.  Concerns regarding medicines are outlined above.  No orders of the defined types were placed in this encounter.  No orders of the defined types were placed in this encounter.   There are no Patient Instructions on file for this visit.   Signed, Sinclair Grooms, MD  08/01/2021 8:46 AM    [place]

## 2021-08-01 ENCOUNTER — Other Ambulatory Visit: Payer: Self-pay

## 2021-08-01 ENCOUNTER — Ambulatory Visit (INDEPENDENT_AMBULATORY_CARE_PROVIDER_SITE_OTHER): Payer: Medicare Other | Admitting: Interventional Cardiology

## 2021-08-01 ENCOUNTER — Encounter: Payer: Self-pay | Admitting: Interventional Cardiology

## 2021-08-01 VITALS — BP 124/80 | HR 63 | Ht 66.0 in | Wt 163.0 lb

## 2021-08-01 DIAGNOSIS — Z720 Tobacco use: Secondary | ICD-10-CM | POA: Diagnosis not present

## 2021-08-01 DIAGNOSIS — E7849 Other hyperlipidemia: Secondary | ICD-10-CM

## 2021-08-01 DIAGNOSIS — I25119 Atherosclerotic heart disease of native coronary artery with unspecified angina pectoris: Secondary | ICD-10-CM

## 2021-08-01 DIAGNOSIS — I10 Essential (primary) hypertension: Secondary | ICD-10-CM | POA: Diagnosis not present

## 2021-08-01 NOTE — Patient Instructions (Signed)

## 2021-11-07 ENCOUNTER — Other Ambulatory Visit: Payer: Self-pay | Admitting: Geriatric Medicine

## 2021-11-07 DIAGNOSIS — F17210 Nicotine dependence, cigarettes, uncomplicated: Secondary | ICD-10-CM

## 2021-12-06 ENCOUNTER — Other Ambulatory Visit: Payer: Self-pay | Admitting: Obstetrics & Gynecology

## 2021-12-06 DIAGNOSIS — Z1231 Encounter for screening mammogram for malignant neoplasm of breast: Secondary | ICD-10-CM

## 2021-12-16 ENCOUNTER — Other Ambulatory Visit: Payer: Self-pay | Admitting: Interventional Cardiology

## 2021-12-16 DIAGNOSIS — I10 Essential (primary) hypertension: Secondary | ICD-10-CM

## 2021-12-16 DIAGNOSIS — I25119 Atherosclerotic heart disease of native coronary artery with unspecified angina pectoris: Secondary | ICD-10-CM

## 2021-12-21 ENCOUNTER — Other Ambulatory Visit: Payer: Self-pay

## 2021-12-21 ENCOUNTER — Ambulatory Visit
Admission: RE | Admit: 2021-12-21 | Discharge: 2021-12-21 | Disposition: A | Discharge status: Home / Self Care | Payer: Medicare Other | Source: Ambulatory Visit | Attending: Geriatric Medicine | Admitting: Geriatric Medicine

## 2021-12-21 DIAGNOSIS — F17210 Nicotine dependence, cigarettes, uncomplicated: Secondary | ICD-10-CM

## 2022-01-30 ENCOUNTER — Other Ambulatory Visit: Payer: Self-pay

## 2022-01-30 ENCOUNTER — Ambulatory Visit (INDEPENDENT_AMBULATORY_CARE_PROVIDER_SITE_OTHER): Payer: Medicare Other | Admitting: Obstetrics & Gynecology

## 2022-01-30 ENCOUNTER — Encounter: Payer: Self-pay | Admitting: Obstetrics & Gynecology

## 2022-01-30 VITALS — BP 126/80 | HR 72 | Ht 66.75 in | Wt 163.0 lb

## 2022-01-30 DIAGNOSIS — Z01419 Encounter for gynecological examination (general) (routine) without abnormal findings: Secondary | ICD-10-CM

## 2022-01-30 DIAGNOSIS — Z9189 Other specified personal risk factors, not elsewhere classified: Secondary | ICD-10-CM

## 2022-01-30 DIAGNOSIS — F17210 Nicotine dependence, cigarettes, uncomplicated: Secondary | ICD-10-CM

## 2022-01-30 DIAGNOSIS — Z90722 Acquired absence of ovaries, bilateral: Secondary | ICD-10-CM

## 2022-01-30 DIAGNOSIS — Z9079 Acquired absence of other genital organ(s): Secondary | ICD-10-CM

## 2022-01-30 DIAGNOSIS — Z90710 Acquired absence of both cervix and uterus: Secondary | ICD-10-CM

## 2022-01-30 DIAGNOSIS — Z780 Asymptomatic menopausal state: Secondary | ICD-10-CM

## 2022-01-30 NOTE — Progress Notes (Signed)
Sonia Robertson [birth year] [id number]   History:    66 y.o.  G3P2A1L2 Married.  Retired x 12/2020.   RP:  Established patient presenting for annual gyn exam    HPI: S/P TAH/BSO.  Postmenopause, on no HRT x 5 yrs.  No pain with intercourse.  Pap 01/2018 Neg. Urine and bowel movements normal.  Breasts normal.  Mammo Neg 03/2021, scheduled in 03/2022.  Body mass index stable 25.72 this year.  Not exercising regularly, enjoys yard work.  Still smoking 1 ppd, counseling on weaning and quitting done.  Health labs with family physician.  [place] 08/2016.  BD Normal 02/2016.  Past medical history,surgical history, family history and social history were all reviewed and documented in the EPIC chart.  Gynecologic History No LMP recorded. Patient has had a hysterectomy.  Obstetric History OB History  Gravida Para Term Preterm AB Living  3 2 2   1 2   SAB IAB Ectopic Multiple Live Births               # Outcome Date GA Lbr Len/2nd Weight Sex Delivery Anes PTL Lv  3 AB           2 Term           1 Term              ROS: A ROS was performed and pertinent positives and negatives are included in the history.  GENERAL: No fevers or chills. HEENT: No change in vision, no earache, sore throat or sinus congestion. NECK: No pain or stiffness. CARDIOVASCULAR: No chest pain or pressure. No palpitations. PULMONARY: No shortness of breath, cough or wheeze. GASTROINTESTINAL: No abdominal pain, nausea, vomiting or diarrhea, melena or bright red blood per rectum. GENITOURINARY: No urinary frequency, urgency, hesitancy or dysuria. MUSCULOSKELETAL: No joint or muscle pain, no back pain, no recent trauma. DERMATOLOGIC: No rash, no itching, no lesions. ENDOCRINE: No polyuria, polydipsia, no heat or cold intolerance. No recent change in weight. HEMATOLOGICAL: No anemia or easy bruising or bleeding. NEUROLOGIC: No headache, seizures, numbness, tingling or weakness. PSYCHIATRIC: No depression, no loss of interest in normal  activity or change in sleep pattern.     Exam:   BP 126/80    Pulse 72    Ht 5' 6.75" (1.695 m)    Wt 163 lb (73.9 kg)    SpO2 98%    BMI 25.72 kg/m   Body mass index is 25.72 kg/m.  General appearance : Well developed well nourished female. No acute distress HEENT: Eyes: no retinal hemorrhage or exudates,  Neck supple, trachea midline, no carotid bruits, no thyroidmegaly Lungs: Clear to auscultation, no rhonchi or wheezes, or rib retractions  Heart: Regular rate and rhythm, no murmurs or gallops Breast:Examined in sitting and supine position were symmetrical in appearance, no palpable masses or tenderness,  no skin retraction, no nipple inversion, no nipple discharge, no skin discoloration, no axillary or supraclavicular lymphadenopathy Abdomen: no palpable masses or tenderness, no rebound or guarding Extremities: no edema or skin discoloration or tenderness  Pelvic: Vulva: Normal             Vagina: No gross lesions or discharge  Cervix/Uterus absent  Adnexa  Without masses or tenderness  Anus: Normal   Assessment/Plan:  66 y.o. female for annual exam   1. Well female exam with routine gynecological exam S/P TAH/BSO.  Postmenopause, on no HRT x 5 yrs.  No pain with intercourse.  Pap 01/2018 Neg. Urine  and bowel movements normal.  Breasts normal.  Mammo Neg 03/2021, scheduled in 03/2022. Body mass index stable 25.72 this year.  Not exercising regularly, enjoys yard work.  Still smoking 1 ppd, counseling on weaning and quitting done.  Health labs with family physician.  [place] 08/2016.  BD Normal 02/2016.  2. S/P TAH-BSO  3. Postmenopausal S/P TAH/BSO.  Postmenopause, on no HRT x 5 yrs.  No pain with intercourse. BD normal in 02/2016.  4. Cigarette smoker Counseling on the importance of weaning and quitting cigarette smoking.  5. Other specified personal risk factors, not elsewhere classified   Princess Bruins MD, 10:09 AM 01/30/2022

## 2022-03-20 ENCOUNTER — Ambulatory Visit
Admission: RE | Admit: 2022-03-20 | Discharge: 2022-03-20 | Disposition: A | Discharge status: Home / Self Care | Payer: Medicare Other | Source: Ambulatory Visit | Attending: Obstetrics & Gynecology | Admitting: Obstetrics & Gynecology

## 2022-03-20 DIAGNOSIS — Z1231 Encounter for screening mammogram for malignant neoplasm of breast: Secondary | ICD-10-CM

## 2022-05-08 ENCOUNTER — Other Ambulatory Visit: Payer: Self-pay | Admitting: Geriatric Medicine

## 2022-05-08 DIAGNOSIS — R911 Solitary pulmonary nodule: Secondary | ICD-10-CM

## 2022-06-08 ENCOUNTER — Ambulatory Visit
Admission: RE | Admit: 2022-06-08 | Discharge: 2022-06-08 | Disposition: A | Discharge status: Home / Self Care | Payer: Medicare Other | Source: Ambulatory Visit | Attending: Geriatric Medicine | Admitting: Geriatric Medicine

## 2022-06-08 DIAGNOSIS — R911 Solitary pulmonary nodule: Secondary | ICD-10-CM

## 2022-06-09 ENCOUNTER — Other Ambulatory Visit: Payer: Self-pay | Admitting: Geriatric Medicine

## 2022-06-09 DIAGNOSIS — R911 Solitary pulmonary nodule: Secondary | ICD-10-CM

## 2022-08-01 NOTE — Progress Notes (Unsigned)
Cardiology Office Note:    Date:  08/02/2022   ID:  Sonia Robertson, DOB 1956/03/06, MRN [id number]  PCP:  Sonia Manes, MD  Cardiologist:  Sonia Grooms, MD   Referring MD: Sonia Manes, MD   Chief Complaint  Patient presents with   Coronary Artery Disease   Hypertension   Hyperlipidemia    History of Present Illness:    Sonia Robertson is a 66 y.o. female with a hx of CAD wiith history of PCI on diagonal 1987, Hyperlipidemia, longstanding tobacco abuse, chronic lumbar disc disease with chronic pain, and hypertension  Sonia Robertson is doing well.  She still smokes.   She had palpitations Sunday before last that were described as her heart pounding in the chest.  She also says that there is a sore spot on the left lower mid sternal area.  She uses nitroglycerin and feels that it made it better.  This discomfort was not long lasting.  It was likely musculoskeletal.  She has chronic complaints that it feels her bra is too tight.  This feeling is present many times when she is just sitting around.  It is not precipitated by physical activity and not associated with diaphoresis, sweating, or chest pain.  Past Medical History:  Diagnosis Date   Adopted    pt is adopted   Angina    NONE SINCE MARCH   SEES DR Sonia Robertson  -[place].-, 08-11-16"remains with sporadic episodes of chest pain relived with NTG use_ Sonia Robertson aware.   Arthritis    "thinks arthritis in neck"   Fibroid    Hypertension    Hypothyroidism    Myocardial infarction ([place])    1988   Shortness of breath    WITH EXERTION   Thyroid disease     Past Surgical History:  Procedure Laterality Date   ABDOMINAL HYSTERECTOMY  2004   TAH BSO   BACK SURGERY     fusion lumbar   CARDIAC CATHETERIZATION     COLONOSCOPY WITH PROPOFOL N/A 08/14/2016   Procedure: COLONOSCOPY WITH PROPOFOL;  Surgeon: Sonia Fair, MD;  Location: [facility];  Service: Endoscopy;  Laterality: N/A;   KNEE SURGERY     Arthroscopic    OOPHORECTOMY     BSO    Current Medications: Current Meds  Medication Sig   amLODipine (NORVASC) 10 MG tablet Take 10 mg by mouth daily.   aspirin EC 81 MG tablet Take 1 tablet (81 mg total) by mouth daily.   atenolol (TENORMIN) 25 MG tablet Take 25 mg by mouth daily. Takes 1/2 tablet(12.5 mg) daily AM/ 1/2 tablet (12.5 mg) daily PM = 25 mg daily total   atorvastatin (LIPITOR) 40 MG tablet TAKE 1 TABLET BY MOUTH EVERY DAY   Calcium Carbonate-Vit D-Min (CALCIUM 1200 PO) Take 1 tablet by mouth daily.   Coenzyme Q10 (COQ10) 200 MG CAPS Take 1 capsule by mouth at bedtime.    ibuprofen (ADVIL,MOTRIN) 200 MG tablet Take 800 mg by mouth every 8 (eight) hours as needed. Pain/ swelling.  Only if she has not taken Diclofenac.   isosorbide mononitrate (IMDUR) 60 MG 24 hr tablet TAKE 1 TABLET(60 MG) BY MOUTH DAILY   levothyroxine (SYNTHROID, LEVOTHROID) 125 MCG tablet Take 125 mcg by mouth daily before breakfast.   Multiple Vitamin (MULTIVITAMIN WITH MINERALS) TABS Take 1 tablet by mouth daily.   nitroGLYCERIN (NITROSTAT) 0.4 MG SL tablet Place 1 tablet (0.4 mg total) under the tongue every 5 (five) minutes  as needed for chest pain.   Vitamin D, Cholecalciferol, 1000 UNITS TABS Take 1 tablet by mouth at bedtime.    Zinc 15 MG CAPS Take 1 capsule by mouth daily.     Allergies:   Morphine and related   Social History   Socioeconomic History   Marital status: Married    Spouse name: Not on file   Number of children: Not on file   Years of education: Not on file   Highest education level: Not on file  Occupational History   Not on file  Tobacco Use   Smoking status: Every Day    Packs/day: 0.50    Types: Cigarettes   Smokeless tobacco: Never   Tobacco comments:    Chantix is helping decrease use  Vaping Use   Vaping Use: Never used  Substance and Sexual Activity   Alcohol use: No    Alcohol/week: 0.0 standard drinks of alcohol   Drug use: No   Sexual activity: Yes    Birth  control/protection: Surgical    Comment: 1ST INTERCOURSE- 13, PARTNERS- GREATER THAN 5  Other Topics Concern   Not on file  Social History Narrative   Not on file   Social Determinants of Health   Financial Resource Strain: Not on file  Food Insecurity: Not on file  Transportation Needs: Not on file  Physical Activity: Not on file  Stress: Not on file  Social Connections: Not on file     Family History: The patient's family history includes Hypertension in her mother. There is no history of Breast cancer. She was adopted.  ROS:   Please see the history of present illness.    Low back discomfort.  Continues to smoke cigarettes.  Left parasternal/lower sternal tenderness.  All other systems reviewed and are negative.  EKGs/Labs/Other Studies Reviewed:    The following studies were reviewed today:  Coronary CTA with FFR 02/2021: IMPRESSION: 1. Mild CAD, CADRADS = 2.  Mild CAD in mid LAD and OM2.   2. Coronary calcium score is 53, which places the patient in the 72nd percentile for age and sex matched control.   3. Normal coronary origin with right dominance.   4. Mild dilation of main pulmonary artery, 32 mm, may indicate pulmonary hypertension.   EKG:  EKG sinus bradycardia, flattened ST segment 2, 3, and aVF.  When compared to prior tracing, no changes noted.  PR interval is normal.  Recent Labs: No results found for requested labs within last 365 days.  Recent Lipid Panel    Component Value Date/Time   CHOL 121 07/02/2020 0739   TRIG 71 07/02/2020 0739   HDL 43 07/02/2020 0739   CHOLHDL 2.8 07/02/2020 0739   CHOLHDL 4 03/10/2015 1718   VLDL 13.6 03/10/2015 1718   LDLCALC 63 07/02/2020 0739   LDLDIRECT 68.6 06/16/2014 0830    Physical Exam:    VS:  BP 118/76   Pulse (!) 57   Ht 5' 6.75" (1.695 m)   Wt 158 lb 12.8 oz (72 kg)   SpO2 98%   BMI 25.06 kg/m     Wt Readings from Last 3 Encounters:  08/02/22 158 lb 12.8 oz (72 kg)  01/30/22 163 lb (73.9 kg)   08/01/21 163 lb (73.9 kg)     GEN: Compatible with age.  Odor of cigarette smoke. No acute distress HEENT: Normal NECK: No JVD. LYMPHATICS: No lymphadenopathy CARDIAC: 1-2/6 right upper sternal systolic murmur. RRR no gallop, or edema. VASCULAR:  Normal  Pulses. No bruits. RESPIRATORY:  Clear to auscultation without rales, wheezing or rhonchi  ABDOMEN: Soft, non-tender, non-distended, No pulsatile mass, MUSCULOSKELETAL: No deformity  SKIN: Warm and dry NEUROLOGIC:  Alert and oriented x 3 PSYCHIATRIC:  Normal affect   ASSESSMENT:    1. Coronary artery disease involving native coronary artery of native heart with angina pectoris ([place])   2. Essential hypertension   3. Other hyperlipidemia   4. Tobacco use    PLAN:    In order of problems listed above:  Smoking cessation again strongly encouraged.  Also discussed increasing physical activity but severely limited by disabling lower back discomfort.  She is compliant with her medication regimen.  Recommended that she consider swimming. Blood pressure control is excellent on her current regimen that includes amlodipine, Tenormin, Imdur. Continue atorvastatin 40 mg/day.  LDL target less than 70.  Most recent LDL 65 September 2022. Encouraged smoking cessation.  She is unable to commit.  Overall education and awareness concerning secondary risk prevention was discussed in detail: LDL less than 70, hemoglobin A1c less than 7, blood pressure target less than 130/80 mmHg, >150 minutes of moderate aerobic activity per week, avoidance of smoking, weight control (via diet and exercise), and continued surveillance/management of/for obstructive sleep apnea.  Recommended Dr. Johney Frame.  Clinical follow-up needed in 1 year.  I Robertson review laboratory data once it is done by Dr. Felipa Eth.  Medication Adjustments/Labs and Tests Ordered: Current medicines are reviewed at length with the patient today.  Concerns regarding medicines are outlined  above.  No orders of the defined types were placed in this encounter.  No orders of the defined types were placed in this encounter.   There are no Patient Instructions on file for this visit.   Signed, Sonia Grooms, MD  08/02/2022 9:28 AM    [place]

## 2022-08-02 ENCOUNTER — Encounter: Payer: Self-pay | Admitting: Interventional Cardiology

## 2022-08-02 ENCOUNTER — Ambulatory Visit: Payer: Medicare Other | Attending: Interventional Cardiology | Admitting: Interventional Cardiology

## 2022-08-02 VITALS — BP 118/76 | HR 57 | Ht 66.75 in | Wt 158.8 lb

## 2022-08-02 DIAGNOSIS — I10 Essential (primary) hypertension: Secondary | ICD-10-CM

## 2022-08-02 DIAGNOSIS — I25119 Atherosclerotic heart disease of native coronary artery with unspecified angina pectoris: Secondary | ICD-10-CM

## 2022-08-02 DIAGNOSIS — E7849 Other hyperlipidemia: Secondary | ICD-10-CM | POA: Diagnosis present

## 2022-08-02 DIAGNOSIS — Z720 Tobacco use: Secondary | ICD-10-CM

## 2022-08-02 NOTE — Patient Instructions (Signed)
Medication Instructions:  Your physician recommends that you continue on your current medications as directed. Please refer to the Current Medication list given to you today.  *If you need a refill on your cardiac medications before your next appointment, please call your pharmacy*  Lab Work: NONE  Testing/Procedures: NONE  Follow-Up: At [facility], you and your health needs are our priority.  As part of our continuing mission to provide you with exceptional heart care, we have created designated Provider Care Teams.  These Care Teams include your primary Cardiologist (physician) and Advanced Practice Providers (APPs -  Physician Assistants and Nurse Practitioners) who all work together to provide you with the care you need, when you need it.  Your next appointment:   1 year(s)  The format for your next appointment:   In Person  Provider:   Gwyndolyn Kaufman, MD  Other Instructions Your physician highly recommends to stop smoking for your heart health.   Steps to Quit Smoking Smoking tobacco is the leading cause of preventable death. It can affect almost every organ in the body. Smoking puts you and those around you at risk for developing many serious chronic diseases.  Quitting smoking can be very challenging. Do not get discouraged if you are not successful the first time. Some people need to make many attempts to quit before they achieve long-term success. Do your best to stick to your quit plan, and talk with your health care provider if you have any questions or concerns.  How do I get ready to quit? When you decide to quit smoking, create a plan to help you succeed. Before you quit: Pick a date to quit. Set a date within the next 2 weeks to give you time to prepare. Write down the reasons why you are quitting. Keep this list in places where you will see it often. Tell your family, friends, and co-workers that you are quitting. Support from people you are close to  can make quitting easier. Talk with your health care provider about your options for quitting smoking. Find out what treatment options are covered by your health insurance. Identify people, places, things, and activities that make you want to smoke (triggers). Avoid them.  What first steps can I take to quit smoking? Throw away all cigarettes at home, at work, and in your car. Throw away smoking accessories, such as Scientist, research (medical). Clean your car. Make sure to empty the ashtray. Clean your home, including curtains and carpets.  What strategies can I use to quit smoking? Talk with your health care provider about combining strategies, such as taking medicines while you are also receiving in-person counseling. Using these two strategies together makes you more likely to succeed in quitting than if you used either strategy on its own.  If you are pregnant or breastfeeding, talk with your health care provider about finding counseling or other support strategies to quit smoking. Do not take medicine to help you quit smoking unless your health care provider tells you to.  Quit right away Quit smoking completely, instead of gradually reducing how much you smoke over a period of time. Stopping smoking right away may be more successful than gradually quitting. Attend in-person counseling to help you build problem-solving skills. You are more likely to succeed in quitting if you attend counseling sessions regularly. Even short sessions of 10 minutes can be effective.  Take medicine You may take medicines to help you quit smoking. Some medicines require a prescription. You can  also purchase over-the-counter medicines. Medicines may have nicotine in them to replace the nicotine in cigarettes. Medicines may: Help to stop cravings. Help to relieve withdrawal symptoms. Your health care provider may recommend: Nicotine patches, gum, or lozenges. Nicotine inhalers or sprays. Non-nicotine medicine  that you take by mouth.  Find resources Find resources and support systems that can help you quit smoking and remain smoke-free after you quit. These resources are most helpful when you use them often. They include: Online chats with a Social worker. Telephone quitlines. Printed Furniture conservator/restorer. Support groups or group counseling. Text messaging programs. Mobile phone apps or applications. Use apps that can help you stick to your quit plan by providing reminders, tips, and encouragement. Examples of free services include Quit Guide from the CDC and smokefree.gov  What can I do to make it easier to quit? Reach out to your family and friends for support and encouragement. Call telephone quitlines, such as 1-800-QUIT-NOW, reach out to support groups, or work with a counselor for support. Ask people who smoke to avoid smoking around you. Avoid places that trigger you to smoke, such as bars, parties, or smoke-break areas at work. Spend time with people who do not smoke. Lessen the stress in your life. Stress can be a smoking trigger for some people. To lessen stress, try: Exercising regularly. Doing deep-breathing exercises. Doing yoga. Meditating.  What benefits will I see if I quit smoking? Over time, you should start to see positive results, such as: Improved sense of smell and taste. Decreased coughing and sore throat. Slower heart rate. Lower blood pressure. Clearer and healthier skin. The ability to breathe more easily. Fewer sick days.  Summary Quitting smoking can be very challenging. Do not get discouraged if you are not successful the first time. Some people need to make many attempts to quit before they achieve long-term success. When you decide to quit smoking, create a plan to help you succeed. Quit smoking right away, not slowly over a period of time. Find resources and support systems that can help you quit smoking and remain smoke-free after you quit.  This  information is not intended to replace advice given to you by your health care provider. Make sure you discuss any questions you have with your health care provider.  Document Revised: 11/11/2021 Document Reviewed: 11/11/2021 Elsevier Patient Education  [place]

## 2022-09-11 ENCOUNTER — Other Ambulatory Visit: Payer: Self-pay | Admitting: Interventional Cardiology

## 2022-09-11 DIAGNOSIS — I25119 Atherosclerotic heart disease of native coronary artery with unspecified angina pectoris: Secondary | ICD-10-CM

## 2022-09-11 DIAGNOSIS — I10 Essential (primary) hypertension: Secondary | ICD-10-CM

## 2022-12-19 ENCOUNTER — Telehealth: Payer: Self-pay | Admitting: Cardiology

## 2022-12-19 MED ORDER — NITROGLYCERIN 0.4 MG SL SUBL: 0.4000 mg | SUBLINGUAL_TABLET | SUBLINGUAL | 7 refills | Status: AC | PRN

## 2022-12-19 NOTE — Telephone Encounter (Signed)
*  STAT* If patient is at the pharmacy, call can be transferred to refill team.   1. Which medications need to be refilled? (please list name of each medication and dose if known)  nitroGLYCERIN (NITROSTAT) 0.4 MG SL tablet    2. Which pharmacy/location (including street and city if local pharmacy) is medication to be sent to? [id number] - [place], [place] - [address]   3. Do they need a 30 day or 90 day supply? 30 day   Patient states the medication is expired.

## 2022-12-19 NOTE — Telephone Encounter (Signed)
Pt's medication was sent to pt's pharmacy as requested. Confirmation received.

## 2023-01-31 ENCOUNTER — Encounter: Payer: Self-pay | Admitting: Obstetrics & Gynecology

## 2023-01-31 ENCOUNTER — Ambulatory Visit (INDEPENDENT_AMBULATORY_CARE_PROVIDER_SITE_OTHER): Payer: Medicare Other | Admitting: Obstetrics & Gynecology

## 2023-01-31 VITALS — BP 120/80 | HR 66 | Ht 66.0 in | Wt 155.0 lb

## 2023-01-31 DIAGNOSIS — Z780 Asymptomatic menopausal state: Secondary | ICD-10-CM | POA: Diagnosis not present

## 2023-01-31 DIAGNOSIS — Z90710 Acquired absence of both cervix and uterus: Secondary | ICD-10-CM | POA: Diagnosis not present

## 2023-01-31 DIAGNOSIS — F17210 Nicotine dependence, cigarettes, uncomplicated: Secondary | ICD-10-CM

## 2023-01-31 DIAGNOSIS — Z9289 Personal history of other medical treatment: Secondary | ICD-10-CM

## 2023-01-31 DIAGNOSIS — Z9189 Other specified personal risk factors, not elsewhere classified: Secondary | ICD-10-CM

## 2023-01-31 DIAGNOSIS — Z01419 Encounter for gynecological examination (general) (routine) without abnormal findings: Secondary | ICD-10-CM

## 2023-01-31 DIAGNOSIS — B009 Herpesviral infection, unspecified: Secondary | ICD-10-CM

## 2023-01-31 NOTE — Progress Notes (Signed)
ALYNDA LOREN 29-Oct-1956 [id number]   History:    67 y.o. G3P2A1L2 Married.  Retired x 12/2020.   RP:  Established patient presenting for annual gyn exam    HPI: S/P TAH/BSO.  Postmenopause, on no HRT x 6 yrs.  No pain with intercourse.  Pap 01/2018 Neg. Urine and bowel movements normal.  Breasts normal.  Mammo Neg 03/2021, scheduled in 03/2022.  Body mass index stable 25.02 this year.  Not exercising regularly, enjoys yard work.  Still smoking 1 ppd, counseling on weaning and quitting done.  Health labs with family physician.  Colono 08/2016.  BD Normal 02/2016.  Past medical history,surgical history, family history and social history were all reviewed and documented in the EPIC chart.  Gynecologic History No LMP recorded. Patient has had a hysterectomy.  Obstetric History OB History  Gravida Para Term Preterm AB Living  '3 2 2   1 2  '$ SAB IAB Ectopic Multiple Live Births  1            # Outcome Date GA Lbr Len/2nd Weight Sex Delivery Anes PTL Lv  3 SAB           2 Term           1 Term              ROS: A ROS was performed and pertinent positives and negatives are included in the history. GENERAL: No fevers or chills. HEENT: No change in vision, no earache, sore throat or sinus congestion. NECK: No pain or stiffness. CARDIOVASCULAR: No chest pain or pressure. No palpitations. PULMONARY: No shortness of breath, cough or wheeze. GASTROINTESTINAL: No abdominal pain, nausea, vomiting or diarrhea, melena or bright red blood per rectum. GENITOURINARY: No urinary frequency, urgency, hesitancy or dysuria. MUSCULOSKELETAL: No joint or muscle pain, no back pain, no recent trauma. DERMATOLOGIC: No rash, no itching, no lesions. ENDOCRINE: No polyuria, polydipsia, no heat or cold intolerance. No recent change in weight. HEMATOLOGICAL: No anemia or easy bruising or bleeding. NEUROLOGIC: No headache, seizures, numbness, tingling or weakness. PSYCHIATRIC: No depression, no loss of interest in normal  activity or change in sleep pattern.     Exam:   BP 120/80   Pulse 66   Ht '5\' 6"'$  (1.676 m)   Wt 155 lb (70.3 kg)   SpO2 99%   BMI 25.02 kg/m   Body mass index is 25.02 kg/m.  General appearance : Well developed well nourished female. No acute distress HEENT: Eyes: no retinal hemorrhage or exudates,  Neck supple, trachea midline, no carotid bruits, no thyroidmegaly Lungs: Clear to auscultation, no rhonchi or wheezes, or rib retractions  Heart: Regular rate and rhythm, no murmurs or gallops Breast:Examined in sitting and supine position were symmetrical in appearance, no palpable masses or tenderness,  no skin retraction, no nipple inversion, no nipple discharge, no skin discoloration, no axillary or supraclavicular lymphadenopathy Abdomen: no palpable masses or tenderness, no rebound or guarding Extremities: no edema or skin discoloration or tenderness  Pelvic: Vulva: Normal             Vagina: No gross lesions or discharge  Cervix/Uterus absent  Adnexa  Without masses or tenderness  Anus: Normal   Assessment/Plan:  67 y.o. female for annual exam   1. Well female exam with routine gynecological exam S/P TAH/BSO.  Postmenopause, on no HRT x 6 yrs.  No pain with intercourse.  Pap 01/2018 Neg. Urine and bowel movements normal.  Breasts normal.  Mammo  Neg 03/2021, scheduled in 03/2022.  Body mass index stable 25.02 this year.  Not exercising regularly, enjoys yard work.  Still smoking 1 ppd, counseling on weaning and quitting done.  Health labs with family physician.  Colono 08/2016.  BD Normal 02/2016.  2. S/P TAH-BSO  3. Postmenopausal Postmenopause, on no HRT x 6 yrs.  No pain with intercourse.  BD normal in 02/2016.  4. Cigarette smoker  Still smoking 1 ppd, counseling on weaning and quitting done.   5. Personal history of other medical treatment  6. Other specified personal risk factors, not elsewhere classified  Other orders - Ascorbic Acid (VITAMIN C PO); Take by mouth.    Princess Bruins MD, 9:59 AM

## 2023-02-09 ENCOUNTER — Encounter: Payer: Self-pay | Admitting: Obstetrics & Gynecology

## 2023-06-18 ENCOUNTER — Other Ambulatory Visit: Payer: Self-pay | Admitting: Internal Medicine

## 2023-06-18 DIAGNOSIS — R911 Solitary pulmonary nodule: Secondary | ICD-10-CM

## 2023-06-28 ENCOUNTER — Encounter: Payer: Self-pay | Admitting: Cardiology

## 2023-07-11 ENCOUNTER — Ambulatory Visit
Admission: RE | Admit: 2023-07-11 | Discharge: 2023-07-11 | Disposition: A | Discharge status: Home / Self Care | Payer: Medicare Other | Source: Ambulatory Visit | Attending: Internal Medicine | Admitting: Internal Medicine

## 2023-07-11 DIAGNOSIS — R911 Solitary pulmonary nodule: Secondary | ICD-10-CM

## 2023-07-27 ENCOUNTER — Ambulatory Visit: Payer: Medicare Other | Admitting: Cardiology

## 2023-08-29 ENCOUNTER — Ambulatory Visit: Payer: Medicare Other | Admitting: Cardiology

## 2023-10-09 ENCOUNTER — Other Ambulatory Visit (HOSPITAL_COMMUNITY): Payer: Self-pay | Admitting: Radiology

## 2023-10-09 DIAGNOSIS — R0609 Other forms of dyspnea: Secondary | ICD-10-CM

## 2023-10-30 ENCOUNTER — Encounter: Payer: Self-pay | Admitting: Internal Medicine

## 2023-10-30 ENCOUNTER — Ambulatory Visit: Payer: Medicare Other | Attending: Internal Medicine | Admitting: Internal Medicine

## 2023-10-30 VITALS — BP 130/92 | HR 58 | Ht 66.0 in | Wt 157.4 lb

## 2023-10-30 DIAGNOSIS — E7849 Other hyperlipidemia: Secondary | ICD-10-CM | POA: Diagnosis present

## 2023-10-30 DIAGNOSIS — I25119 Atherosclerotic heart disease of native coronary artery with unspecified angina pectoris: Secondary | ICD-10-CM | POA: Insufficient documentation

## 2023-10-30 DIAGNOSIS — I10 Essential (primary) hypertension: Secondary | ICD-10-CM | POA: Insufficient documentation

## 2023-10-30 NOTE — Progress Notes (Signed)
Cardiology Office Note:    Date:  10/30/2023   ID:  Sonia Robertson, DOB 10-25-1956, MRN [id number]   Pt presents for follow up of CAD    History of Present Illness:    Sonia Robertson is a 67 y.o. female with a hx of CAD wiith history of PCI on diagonal 1987, Hyperlipidemia, longstanding tobacco abuse, chronic lumbar disc disease with chronic pain, and hypertension  She was previously followed by Mendel Ryder    Last seen in Aug 2023     Breathing is OK  Denies  CP  Pt says she is set up for a breathing test tomorrow    Br  Egg and toast   Coffee (splenda, half/half) Lunch  Varies    Poss leftovers Dinner   Whatever fixes  Meat /veggies   Milk Snacks  Popcorn,   Past Medical History:  Diagnosis Date   Adopted    pt is adopted   Angina    NONE SINCE MARCH   SEES DR H SMITH  -LOV 6'17 Epic.-, 08-11-16"remains with sporadic episodes of chest pain relived with NTG use_ Dr. Katrinka Blazing aware.   Arthritis    "thinks arthritis in neck"   Fibroid    HSV-1 infection    Hypertension    Hypothyroidism    Myocardial infarction (HCC)    1988   Shortness of breath    WITH EXERTION   Thyroid disease     Past Surgical History:  Procedure Laterality Date   ABDOMINAL HYSTERECTOMY  2004   TAH BSO   BACK SURGERY     fusion lumbar   CARDIAC CATHETERIZATION     COLONOSCOPY WITH PROPOFOL N/A 08/14/2016   Procedure: COLONOSCOPY WITH PROPOFOL;  Surgeon: Charolett Bumpers, MD;  Location: [facility];  Service: Endoscopy;  Laterality: N/A;   KNEE SURGERY     Arthroscopic   OOPHORECTOMY     BSO    Current Medications: Current Meds  Medication Sig   amLODipine (NORVASC) 10 MG tablet Take 10 mg by mouth daily.   Ascorbic Acid (VITAMIN C PO) Take by mouth.   aspirin EC 81 MG tablet Take 1 tablet (81 mg total) by mouth daily.   atenolol (TENORMIN) 25 MG tablet Take 25 mg by mouth daily. Takes 1/2 tablet(12.5 mg) daily AM/ 1/2 tablet (12.5 mg) daily PM = 25 mg daily total   atorvastatin (LIPITOR) 40 MG  tablet TAKE 1 TABLET BY MOUTH EVERY DAY   Calcium Carbonate-Vit D-Min (CALCIUM 1200 PO) Take 1 tablet by mouth daily.   Coenzyme Q10 (COQ10) 200 MG CAPS Take 1 capsule by mouth at bedtime.    ibuprofen (ADVIL,MOTRIN) 200 MG tablet Take 800 mg by mouth every 8 (eight) hours as needed. Pain/ swelling.  Only if she has not taken Diclofenac.   isosorbide mononitrate (IMDUR) 60 MG 24 hr tablet TAKE 1 TABLET(60 MG) BY MOUTH DAILY   levothyroxine (SYNTHROID, LEVOTHROID) 125 MCG tablet Take 125 mcg by mouth daily before breakfast.   Multiple Vitamin (MULTIVITAMIN WITH MINERALS) TABS Take 1 tablet by mouth daily.   nitroGLYCERIN (NITROSTAT) 0.4 MG SL tablet Place 1 tablet (0.4 mg total) under the tongue every 5 (five) minutes as needed for chest pain.   Vitamin D, Cholecalciferol, 1000 UNITS TABS Take 1 tablet by mouth at bedtime.    Zinc 15 MG CAPS Take 1 capsule by mouth daily.     Allergies:   Morphine and codeine   Social History   Socioeconomic History  Marital status: Married    Spouse name: Not on file   Number of children: Not on file   Years of education: Not on file   Highest education level: Not on file  Occupational History   Not on file  Tobacco Use   Smoking status: Every Day    Current packs/day: 0.50    Types: Cigarettes   Smokeless tobacco: Never  Vaping Use   Vaping status: Never Used  Substance and Sexual Activity   Alcohol use: No    Alcohol/week: 0.0 standard drinks of alcohol   Drug use: No   Sexual activity: Yes    Partners: Male    Birth control/protection: Surgical    Comment: 1ST INTERCOURSE- 13, PARTNERS- GREATER THAN 5  Other Topics Concern   Not on file  Social History Narrative   Not on file   Social Determinants of Health   Financial Resource Strain: Not on file  Food Insecurity: Not on file  Transportation Needs: Not on file  Physical Activity: Not on file  Stress: Not on file  Social Connections: Not on file     Family History: The  patient's family history includes Hypertension in her mother. She was adopted.  ROS:   Please see the history of present illness.    Low back discomfort.  Continues to smoke cigarettes.  Left parasternal/lower sternal tenderness.  All other systems reviewed and are negative.  EKGs/Labs/Other Studies Reviewed:    The following studies were reviewed today:  Coronary CTA with FFR 02/2021: IMPRESSION: 1. Mild CAD, CADRADS = 2.  Mild CAD in mid LAD and OM2.   2. Coronary calcium score is 53, which places the patient in the 72nd percentile for age and sex matched control.   3. Normal coronary origin with right dominance.   4. Mild dilation of main pulmonary artery, 32 mm, may indicate pulmonary hypertension.   EKG:  EKG SB 58 bpm  Nonspeicific ST changes   .  Recent Labs: No results found for requested labs within last 365 days.  Recent Lipid Panel    Component Value Date/Time   CHOL 121 07/02/2020 0739   TRIG 71 07/02/2020 0739   HDL 43 07/02/2020 0739   CHOLHDL 2.8 07/02/2020 0739   CHOLHDL 4 03/10/2015 1718   VLDL 13.6 03/10/2015 1718   LDLCALC 63 07/02/2020 0739   LDLDIRECT 68.6 06/16/2014 0830    Physical Exam:    VS:  BP (!) 130/92   Pulse (!) 58   Ht 5\' 6"  (1.676 m)   Wt 157 lb 6.4 oz (71.4 kg)   SpO2 95%   BMI 25.41 kg/m     BP 126/84   on my check  Wt Readings from Last 3 Encounters:  10/30/23 157 lb 6.4 oz (71.4 kg)  01/31/23 155 lb (70.3 kg)  08/02/22 158 lb 12.8 oz (72 kg)     GEN: Compatible with age.  Odor of cigarette smoke. No acute distress HEENT: Normal NECK: No JVD.  No bruits CARDIAC: RRR  No S3  No murmurs    LE   No edema   RESPIRATORY:  Clear to auscultation  ABDOMEN: Soft, No masses    No hepatomegaly    ASSESSMENT:    1. Coronary artery disease involving native coronary artery of native heart with angina pectoris (HCC)   2. Essential hypertension   3. Other hyperlipidemia    PLAN:    1  CAD  Remote intervention to diagonal  (1610)  Doing welll  No symptoms of angina  2  HTN  BP betteron 2nd check  Keep on same meds  Follow   3 HL  Good control on current regimen   Follow  4  Metabolic  Reviewed diet   Low sugar, minimal processed foods Increase walking    5  Tobacco  Counselled on cessation.  Pt's husband also smokes   Follow up in 1 year   Medication Adjustments/Labs and Tests Ordered: Current medicines are reviewed at length with the patient today.  Concerns regarding medicines are outlined above.  Orders Placed This Encounter  Procedures   EKG 12-Lead   No orders of the defined types were placed in this encounter.   There are no Patient Instructions on file for this visit.   Signed, Dietrich Pates, MD  10/30/2023 10:08 AM    [facility]

## 2023-10-30 NOTE — Patient Instructions (Signed)
Medication Instructions:   *If you need a refill on your cardiac medications before your next appointment, please call your pharmacy*   Lab Work:  If you have labs (blood work) drawn today and your tests are completely normal, you will receive your results only by: MyChart Message (if you have MyChart) OR A paper copy in the mail If you have any lab test that is abnormal or we need to change your treatment, we will call you to review the results.   Testing/Procedures:    Follow-Up: At [facility], you and your health needs are our priority.  As part of our continuing mission to provide you with exceptional heart care, we have created designated Provider Care Teams.  These Care Teams include your primary Cardiologist (physician) and Advanced Practice Providers (APPs -  Physician Assistants and Nurse Practitioners) who all work together to provide you with the care you need, when you need it.  We recommend signing up for the patient portal called "MyChart".  Sign up information is provided on this After Visit Summary.  MyChart is used to connect with patients for Virtual Visits (Telemedicine).  Patients are able to view lab/test results, encounter notes, upcoming appointments, etc.  Non-urgent messages can be sent to your provider as well.   To learn more about what you can do with MyChart, go to [email or web address].    Your next appointment:   1 year(s)  DR Dietrich Pates     Other Instructions

## 2023-10-31 ENCOUNTER — Ambulatory Visit (HOSPITAL_COMMUNITY)
Admission: RE | Admit: 2023-10-31 | Discharge: 2023-10-31 | Disposition: A | Discharge status: Home / Self Care | Payer: Medicare Other | Source: Ambulatory Visit | Attending: Internal Medicine | Admitting: Internal Medicine

## 2023-10-31 DIAGNOSIS — R0609 Other forms of dyspnea: Secondary | ICD-10-CM | POA: Insufficient documentation

## 2023-10-31 LAB — PULMONARY FUNCTION TEST
DL/VA % pred: 75 %
DL/VA: 3.1 ml/min/mmHg/L
DLCO unc % pred: 75 %
DLCO unc: 15.67 ml/min/mmHg
FEF 25-75 Post: 0.95 L/s
FEF 25-75 Pre: 0.72 L/s
FEF2575-%Change-Post: 32 %
FEF2575-%Pred-Post: 46 %
FEF2575-%Pred-Pre: 34 %
FEV1-%Change-Post: 9 %
FEV1-%Pred-Post: 79 %
FEV1-%Pred-Pre: 72 %
FEV1-Post: 1.98 L
FEV1-Pre: 1.81 L
FEV1FVC-%Change-Post: 7 %
FEV1FVC-%Pred-Pre: 77 %
FEV6-%Change-Post: 6 %
FEV6-%Pred-Post: 95 %
FEV6-%Pred-Pre: 89 %
FEV6-Post: 2.99 L
FEV6-Pre: 2.81 L
FEV6FVC-%Change-Post: 4 %
FEV6FVC-%Pred-Post: 100 %
FEV6FVC-%Pred-Pre: 95 %
FVC-%Change-Post: 1 %
FVC-%Pred-Post: 94 %
FVC-%Pred-Pre: 93 %
FVC-Post: 3.1 L
FVC-Pre: 3.06 L
Post FEV1/FVC ratio: 64 %
Post FEV6/FVC ratio: 96 %
Pre FEV1/FVC ratio: 59 %
Pre FEV6/FVC Ratio: 92 %
RV % pred: 114 %
RV: 2.61 L
TLC % pred: 105 %
TLC: 5.65 L

## 2023-10-31 LAB — LAB REPORT - SCANNED
Albumin, Urine POC: 0.7
Albumin/Creatinine Ratio, Urine, POC: 11
Creatinine, POC: 64 mg/dL
EGFR: 98
TSH: 0.77 (ref 0.41–5.90)

## 2023-10-31 MED ORDER — ALBUTEROL SULFATE (2.5 MG/3ML) 0.083% IN NEBU
2.5000 mg | INHALATION_SOLUTION | Freq: Once | RESPIRATORY_TRACT | Status: AC
  Administered 2023-10-31: 2.5 mg via RESPIRATORY_TRACT

## 2024-02-15 ENCOUNTER — Other Ambulatory Visit: Payer: Self-pay | Admitting: Radiology

## 2024-02-15 DIAGNOSIS — Z1231 Encounter for screening mammogram for malignant neoplasm of breast: Secondary | ICD-10-CM

## 2024-02-21 ENCOUNTER — Encounter: Payer: Self-pay | Admitting: Radiology

## 2024-02-21 ENCOUNTER — Ambulatory Visit (INDEPENDENT_AMBULATORY_CARE_PROVIDER_SITE_OTHER): Admitting: Radiology

## 2024-02-21 ENCOUNTER — Other Ambulatory Visit (HOSPITAL_COMMUNITY)
Admission: RE | Admit: 2024-02-21 | Discharge: 2024-02-21 | Disposition: A | Discharge status: Home / Self Care | Source: Ambulatory Visit | Attending: Radiology | Admitting: Radiology

## 2024-02-21 VITALS — BP 130/84 | HR 70 | Ht 67.0 in | Wt 156.0 lb

## 2024-02-21 DIAGNOSIS — B009 Herpesviral infection, unspecified: Secondary | ICD-10-CM

## 2024-02-21 DIAGNOSIS — Z01419 Encounter for gynecological examination (general) (routine) without abnormal findings: Secondary | ICD-10-CM | POA: Insufficient documentation

## 2024-02-21 DIAGNOSIS — Z1151 Encounter for screening for human papillomavirus (HPV): Secondary | ICD-10-CM | POA: Insufficient documentation

## 2024-02-21 DIAGNOSIS — S34109A Unspecified injury to unspecified level of lumbar spinal cord, initial encounter: Secondary | ICD-10-CM | POA: Diagnosis not present

## 2024-02-21 DIAGNOSIS — Z9189 Other specified personal risk factors, not elsewhere classified: Secondary | ICD-10-CM

## 2024-02-21 DIAGNOSIS — Z124 Encounter for screening for malignant neoplasm of cervix: Secondary | ICD-10-CM

## 2024-02-21 DIAGNOSIS — Z1331 Encounter for screening for depression: Secondary | ICD-10-CM | POA: Diagnosis not present

## 2024-02-21 NOTE — Progress Notes (Signed)
   Sonia Robertson 03-17-1956 [id number]   History:  68 y.o. G3P2 presents for high risk medicare breast and pelvic.  Gynecologic History Hysterectomy: 2004 TAH BSO  Sexually active: yes  Health Maintenance Last Pap: 2019. Results were:  Last mammogram: 2023. Results were: normal, scheduled next week Last colonoscopy: 2017. Results were: normal Last Dexa: 2017. Results were:   Risk Factors for Medicare Patients >/= 5 sexual partners in a lifetime: Yes First intercourse <63 years of age: No H/O STD at any age: Yes Abnormal pap smear, < 3 negative paps within the last 7 years: No DES exposure (women born between [phone]): No Patient is on post breast cancer medication like Femara or, if medication like this is not needed, 5 years post breast cancer diagnosis: No      02/21/2024    1:29 PM  Depression screen PHQ 2/9  Decreased Interest 0  Down, Depressed, Hopeless 0  PHQ - 2 Score 0     Past medical history, past surgical history, family history and social history were all reviewed and documented in the EPIC chart.  ROS:  A ROS was performed and pertinent positives and negatives are included.  Exam:  Vitals:   02/21/24 1326  BP: 130/84  Pulse: 70  SpO2: 99%  Weight: 156 lb (70.8 kg)  Height: 5\' 7"  (1.702 m)   Body mass index is 24.43 kg/m.  General appearance:  Normal Thyroid:  Symmetrical, normal in size, without palpable masses or nodularity. Respiratory  Auscultation:  Clear without wheezing or rhonchi Cardiovascular  Auscultation:  Regular rate, without rubs, murmurs or gallops  Edema/varicosities:  Not grossly evident Abdominal  Soft,nontender, without masses, guarding or rebound.  Liver/spleen:  No organomegaly noted  Hernia:  None appreciated  Skin  Inspection:  Grossly normal Breasts: Examined lying and sitting.   Right: Without masses, retractions, nipple discharge or axillary adenopathy.   Left: Without masses, retractions, nipple discharge or axillary  adenopathy. Genitourinary   Inguinal/mons:  Normal without inguinal adenopathy  External genitalia:  Normal appearing vulva with no masses, tenderness, or lesions  BUS/Urethra/Skene's glands:  Normal  Vagina:  Normal appearing with normal color and discharge, no lesions. Atrophy moderate  Cervix:  absent  Uterus:  absent  Adnexa/parametria:  ovaries absent, no adnexal masses  Anus and perineum: Normal   Sonia Robertson, CMA present for exam  Assessment/Plan:   1. Encounter for breast and pelvic examination (Primary) - Cytology - PAP( [place])  2. At high risk for osteoporosis Thyroid disease, estrogen deficiency, smoker - DG Bone Density; Future  3. Unspecified injury to unspecified level of lumbar spinal cord, initial encounter (HCC) Hx of lumbar fusion - DG Bone Density; Future     Discussed SBE, colonoscopy and DEXA screening as appropriate. Encouraged 178mins/week of cardiovascular and weight bearing exercise minimum. Recommend the use of seatbelts and sunscreen consistently.   Return in 1 year for annual or sooner prn.  Sonia Robertson B WHNP-BC 1:49 PM 02/21/2024

## 2024-02-25 LAB — CYTOLOGY - PAP
Comment: NEGATIVE
Diagnosis: NEGATIVE
High risk HPV: NEGATIVE

## 2024-02-27 ENCOUNTER — Ambulatory Visit (INDEPENDENT_AMBULATORY_CARE_PROVIDER_SITE_OTHER)
Admission: RE | Admit: 2024-02-27 | Discharge: 2024-02-27 | Disposition: A | Discharge status: Home / Self Care | Source: Ambulatory Visit | Attending: Radiology | Admitting: Radiology

## 2024-02-27 DIAGNOSIS — Z9189 Other specified personal risk factors, not elsewhere classified: Secondary | ICD-10-CM | POA: Diagnosis not present

## 2024-02-27 DIAGNOSIS — S34109A Unspecified injury to unspecified level of lumbar spinal cord, initial encounter: Secondary | ICD-10-CM | POA: Diagnosis not present

## 2024-03-03 IMAGING — MG MM DIGITAL SCREENING BILAT W/ TOMO AND CAD
6 of 10 series · 6 of 30 positions shown · non-contrast
Comparison: Previous exam(s).

CLINICAL DATA: Screening.

EXAM:
DIGITAL SCREENING BILATERAL MAMMOGRAM WITH TOMOSYNTHESIS AND CAD
TECHNIQUE: Bilateral screening digital craniocaudal and mediolateral oblique
mammograms were obtained. Bilateral screening digital breast
tomosynthesis was performed. The images were evaluated with
computer-aided detection.

[L XCCL synth-2D]
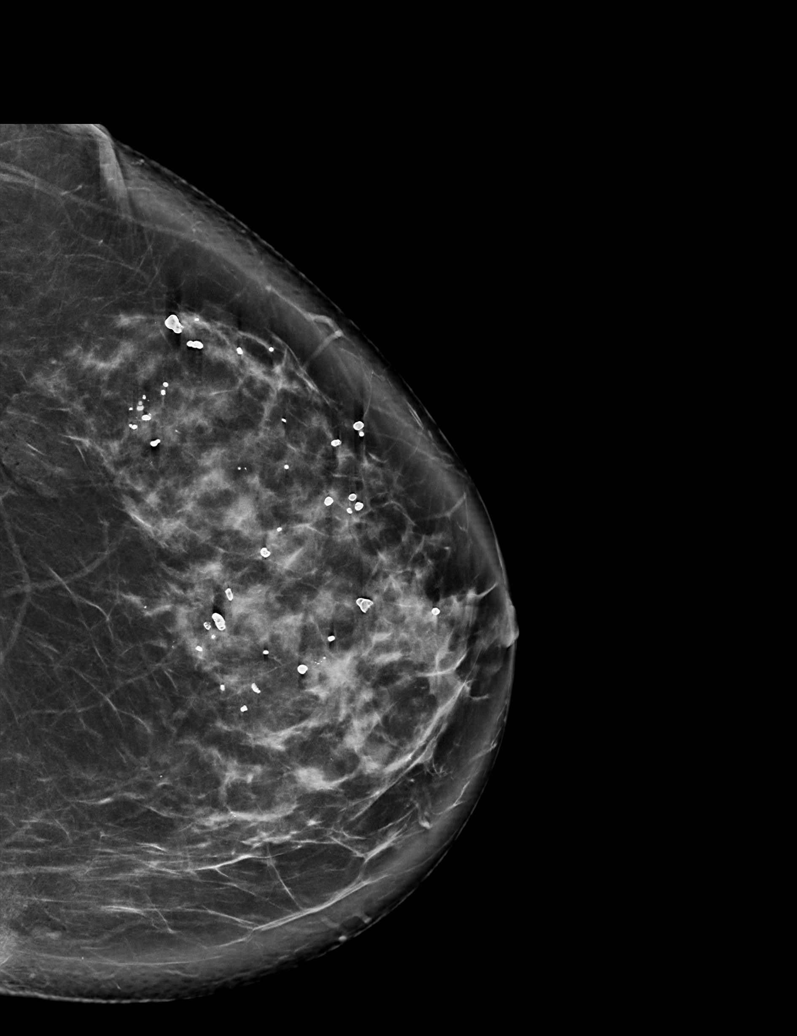

[L CC synth-2D]
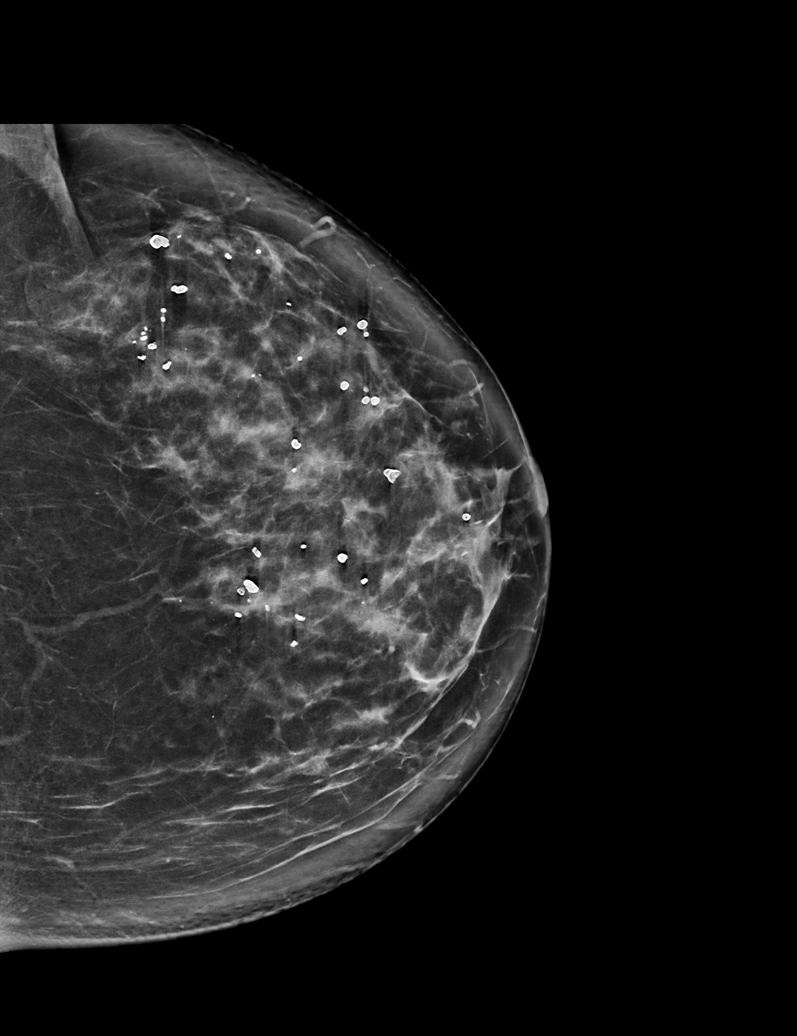

[L MLO synth-2D]
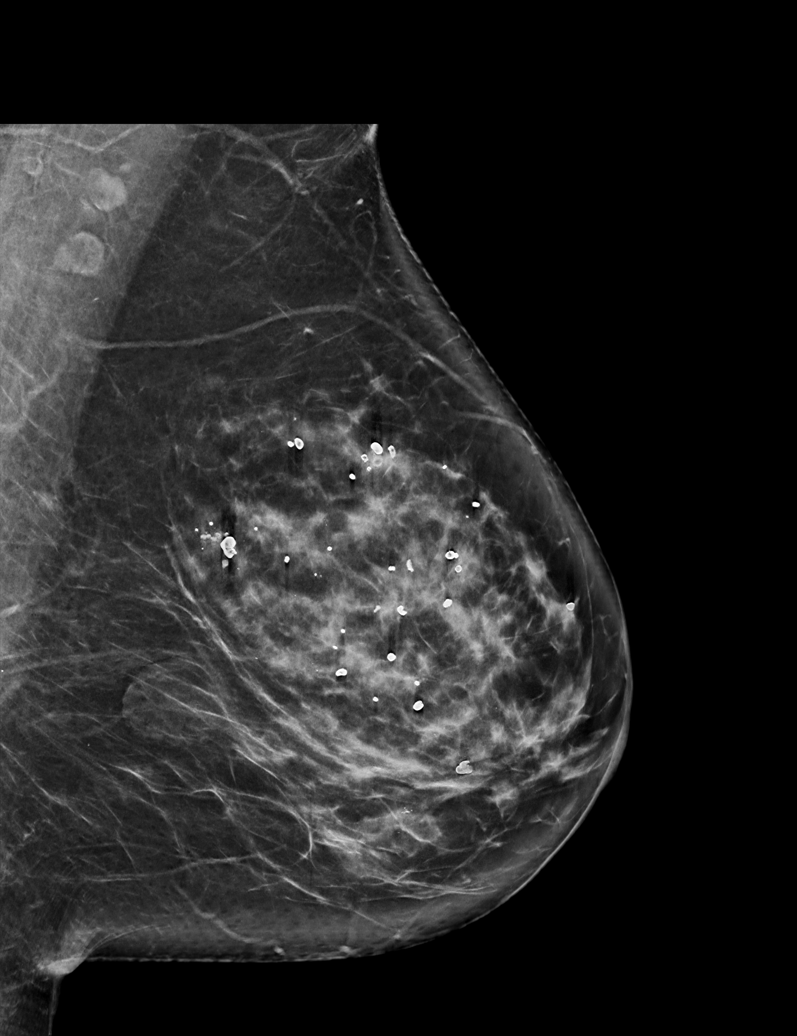

[R CC synth-2D]
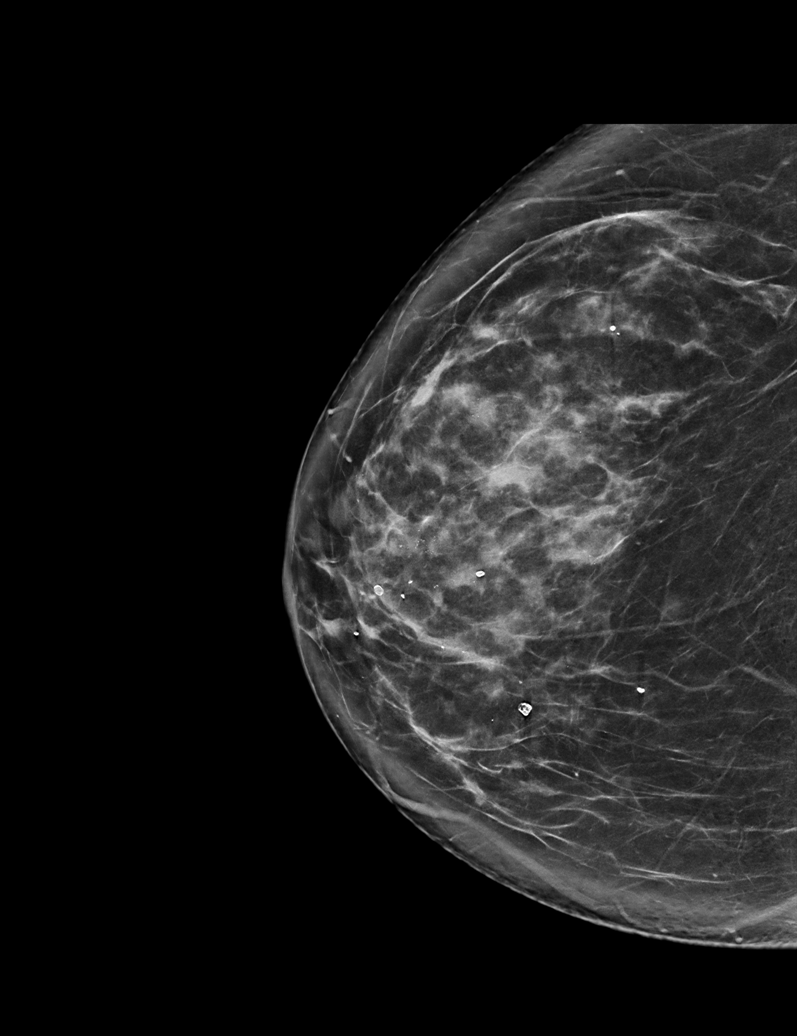

[R MLO synth-2D]
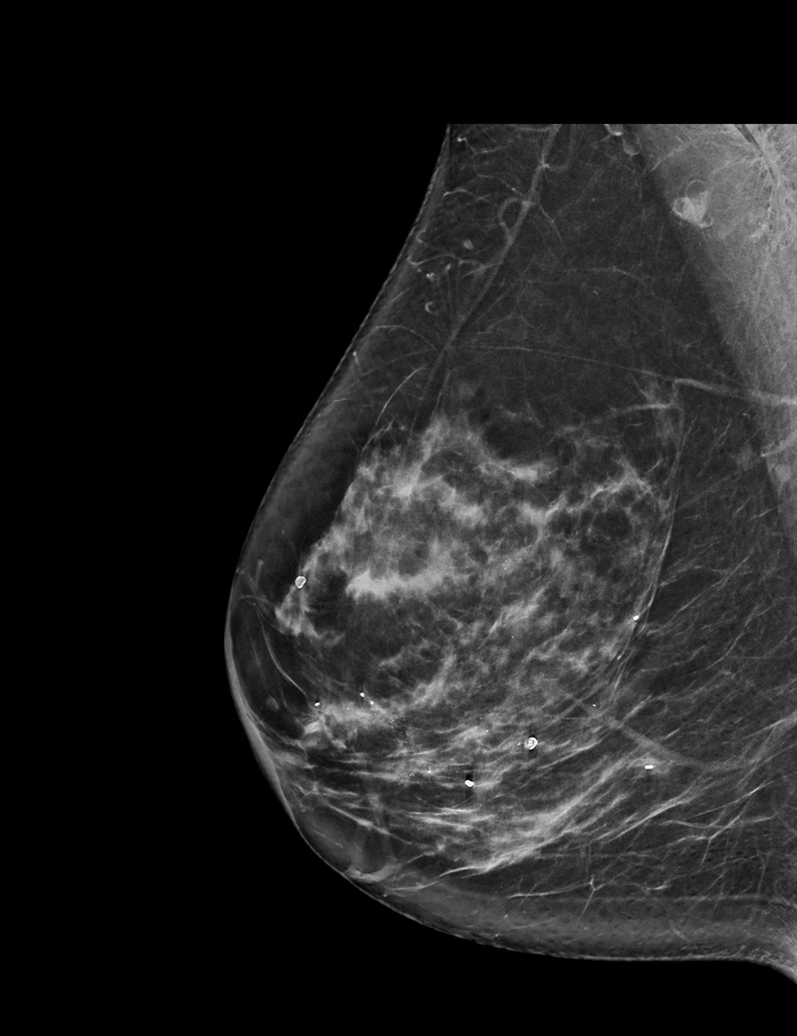

[L XCCL tomo · tomo slice 45/90.0]
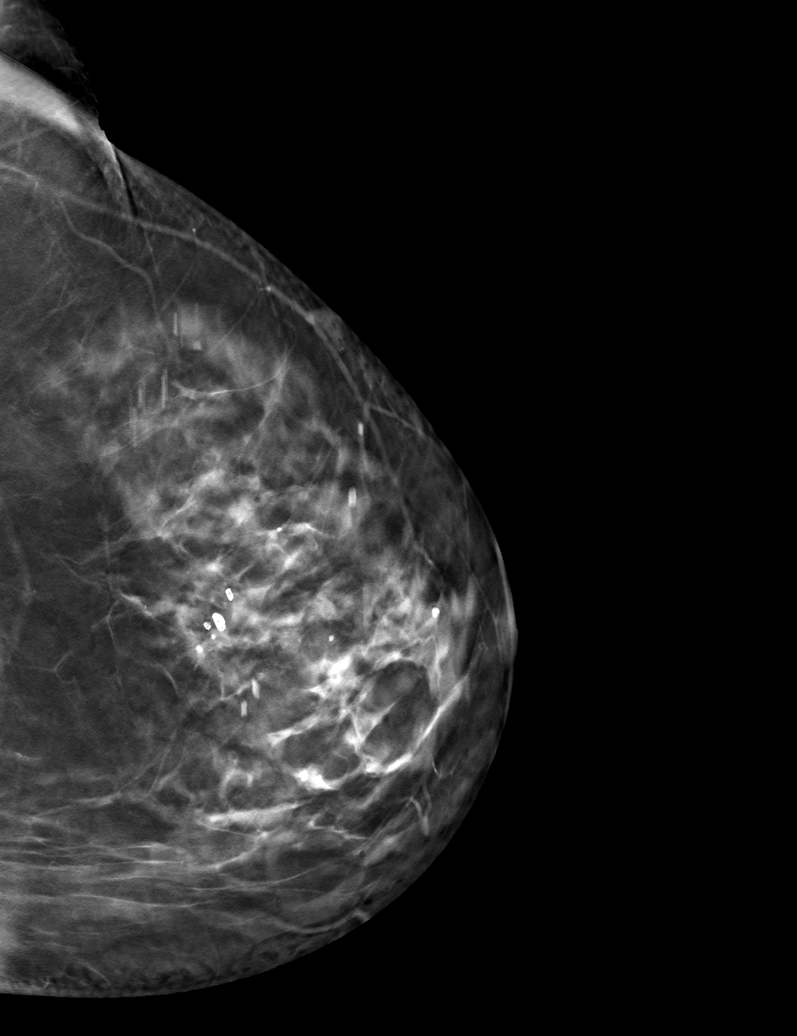

[6 of 30 positions shown; findings below may reference images not displayed]

ACR Breast Density Category c: The breast tissue is heterogeneously
dense, which may obscure small masses.
FINDINGS: There are no findings suspicious for malignancy.
IMPRESSION: No mammographic evidence of malignancy. A result letter of this
screening mammogram will be mailed directly to the patient.

RECOMMENDATION:
Screening mammogram in one year. (Code:Q3-W-BC3)

BI-RADS CATEGORY  1: Negative.

## 2024-03-04 ENCOUNTER — Ambulatory Visit
Admission: RE | Admit: 2024-03-04 | Discharge: 2024-03-04 | Disposition: A | Discharge status: Home / Self Care | Source: Ambulatory Visit | Attending: Radiology | Admitting: Radiology

## 2024-03-04 DIAGNOSIS — Z1231 Encounter for screening mammogram for malignant neoplasm of breast: Secondary | ICD-10-CM

## 2024-03-05 ENCOUNTER — Telehealth: Payer: Self-pay | Admitting: Internal Medicine

## 2024-03-05 NOTE — Telephone Encounter (Signed)
 Pt keeps having a burning in her throat. She can take a nitroGLYCERIN (NITROSTAT) 0.4 MG SL tablet  and it goes away. She has taken 6 tablets in 2 days.

## 2024-03-05 NOTE — Telephone Encounter (Signed)
 Spoke with patient and she stated her throat has been burning. She took nitroglycerin both times and it relieved the burning. She states that has been her only symptom. She would like to know what you think being that nitroglycerin relieved her symptoms. She did state burning throat was a symptom she had when she had her heart attack.  She is going to go to her PCP to have her throat examined but also wanted your input. ED precautions discussed.

## 2024-03-06 NOTE — Telephone Encounter (Signed)
 Left message with patient's husband for patient to call if she needs an appointment after she sees her PCP.

## 2024-03-07 NOTE — Telephone Encounter (Signed)
 I spoke with the pt and she says that she saw her PCP and they did an ECG since she says the burning in her throat was a similar symptom when she had her MI in the past.   They advised her that the ECG was "unchanged" from her past ECG.   She says the burning sensation happens at rest and at one time she took a nitroglycerin that helped.   She denies any further symptoms and if she has any worsening or continued symptoms... she will consider the ED or EMS.   Pt has an APP appt next week.

## 2024-03-12 ENCOUNTER — Encounter: Payer: Self-pay | Admitting: Physician Assistant

## 2024-03-12 ENCOUNTER — Ambulatory Visit: Attending: Physician Assistant | Admitting: Physician Assistant

## 2024-03-12 ENCOUNTER — Encounter (HOSPITAL_COMMUNITY): Payer: Self-pay

## 2024-03-12 ENCOUNTER — Encounter: Payer: Self-pay | Admitting: *Deleted

## 2024-03-12 VITALS — BP 138/70 | HR 70 | Ht 67.0 in | Wt 156.6 lb

## 2024-03-12 DIAGNOSIS — Z720 Tobacco use: Secondary | ICD-10-CM | POA: Diagnosis not present

## 2024-03-12 DIAGNOSIS — I10 Essential (primary) hypertension: Secondary | ICD-10-CM | POA: Diagnosis not present

## 2024-03-12 DIAGNOSIS — E7849 Other hyperlipidemia: Secondary | ICD-10-CM

## 2024-03-12 DIAGNOSIS — R072 Precordial pain: Secondary | ICD-10-CM | POA: Diagnosis present

## 2024-03-12 DIAGNOSIS — I25119 Atherosclerotic heart disease of native coronary artery with unspecified angina pectoris: Secondary | ICD-10-CM

## 2024-03-12 NOTE — Patient Instructions (Signed)
 Medication Instructions:   Your physician recommends that you continue on your current medications as directed. Please refer to the Current Medication list given to you today.  *If you need a refill on your cardiac medications before your next appointment, please call your pharmacy*   Testing/Procedures:  Your physician has requested that you have a STAT lexiscan myoview. For further information please visit [email or web address] Please follow instruction sheet, as given. LEXISCAN NEEDS TO BE SCHEDULED THIS WEEK PER TESSA CONTE PA-C   Follow-Up:  6 WEEKS WITH DR. ROSS OR AN EXTENDER        1st Floor: - Lobby - Registration  - Pharmacy  - Lab - Cafe  2nd Floor: - PV Lab - Diagnostic Testing (echo, CT, nuclear med)  3rd Floor: - Vacant  4th Floor: - TCTS (cardiothoracic surgery) - AFib Clinic - Structural Heart Clinic - Vascular Surgery  - Vascular Ultrasound  5th Floor: - HeartCare Cardiology (general and EP) - Clinical Pharmacy for coumadin, hypertension, lipid, weight-loss medications, and med management appointments    Valet parking services will be available as well.

## 2024-03-12 NOTE — Progress Notes (Signed)
 Cardiology Office Note:  .   Date:  03/12/2024  ID:  Sonia Robertson, DOB [birth year], MRN [id number] PCP: Thana Ates, MD  [facility] Providers Cardiologist:  Lesleigh Noe, MD (Inactive) {  History of Present Illness: .   Sonia Robertson is a 68 y.o. female with a history of CAD with history of PCI at one diagonal 1987, hyperlipidemia, longstanding tobacco abuse, chronic lumbar disc disease with chronic pain and hypertension.  She is previously followed by Dr. Katrinka Blazing.  Seen August 2023.  Breathing okay.  Denies any chest pain.  Patient says that she is set up for a breathing test after her last follow-up.  Today, she presents with a history of heart attack at the age of 60 with recurrent episodes of burning in the throat, similar to the atypical presentation of her initial heart attack. The burning sensation, which started on April 1st, has been occurring intermittently, often in the early hours of the morning and afternoon. The patient has been managing these episodes with nitroglycerin, which provides relief. The patient also reports occasional flutters in the chest, but these are infrequent and not associated with any other symptoms. The patient has been prescribed an inhaler for breathing issues, but has concerns about its side effects, particularly its potential to cause heart palpitations and its contraindication with atenolol, a medication the patient is currently taking. The patient has not been using the inhaler regularly, instead opting to manage her breathing issues with albuterol as needed  Reports no shortness of breath nor dyspnea on exertion. No edema, orthopnea, PND. Reports no palpitations.   Discussed the use of AI scribe software for clinical note transcription with the patient, who gave verbal consent to proceed.  ROS: pertinent ROS in HPI  Studies Reviewed: Marland Kitchen   EKG Interpretation Date/Time:  Wednesday March 12 2024 15:36:03 EDT Ventricular Rate:  52 PR  Interval:  152 QRS Duration:  96 QT Interval:  460 QTC Calculation: 427 R Axis:   60  Text Interpretation: Sinus bradycardia Septal infarct , age undetermined When compared with ECG of 30-Oct-2023 09:56, No significant change was found Confirmed by Jari Favre [phone]) on 03/12/2024 3:49:41 PM    Coronary CTA with FFR 02/2021: IMPRESSION: 1. Mild CAD, CADRADS = 2.  Mild CAD in mid LAD and OM2.   2. Coronary calcium score is 53, which places the patient in the 72nd percentile for age and sex matched control.   3. Normal coronary origin with right dominance.   4. Mild dilation of main pulmonary artery, 32 mm, may indicate pulmonary hypertension.      Physical Exam:   VS:  BP 138/70   Pulse 70   Ht 5\' 7"  (1.702 m)   Wt 156 lb 9.6 oz (71 kg)   SpO2 98%   BMI 24.53 kg/m    Wt Readings from Last 3 Encounters:  03/12/24 156 lb 9.6 oz (71 kg)  02/21/24 156 lb (70.8 kg)  10/30/23 157 lb 6.4 oz (71.4 kg)    GEN: Well nourished, well developed in no acute distress NECK: No JVD; No carotid bruits CARDIAC: RRR, no murmurs, rubs, gallops RESPIRATORY:  Clear to auscultation without rales, wheezing or rhonchi  ABDOMEN: Soft, non-tender, non-distended EXTREMITIES:  No edema; No deformity   ASSESSMENT AND PLAN: .    Anginal equivalent symptoms Episodes of throat burning relieved by nitroglycerin suggest potential cardiac event. Differential includes esophageal spasms or GERD, but cardiac causes prioritized. Stress test recommended  to assess cardiac function. - Order stress test, treadmill or chemical, based on preference and availability before trip. - Ensure adequate supply of nitroglycerin and check expiration date.  Asthma management concerns Hesitant to use prescribed inhaler due to potential side effects and interactions with atenolol. Uses albuterol as needed without significant issues. - Discuss inhaler concerns with primary care physician to explore safer alternatives for heart  patients.  Tobacco abuse   -cessation advised    Dispo: She can return in 6 weeks with APP to review results.  Signed, Sharlene Dory, PA-C

## 2024-03-13 ENCOUNTER — Telehealth: Payer: Self-pay | Admitting: Physician Assistant

## 2024-03-13 NOTE — Telephone Encounter (Signed)
 Unable to reach patient by number listed in her chart. She did open and sign letter with instructions sent through my chart about her STRESS TEST on 03/14/24.

## 2024-03-14 ENCOUNTER — Ambulatory Visit (HOSPITAL_COMMUNITY): Attending: Physician Assistant

## 2024-03-14 ENCOUNTER — Encounter (HOSPITAL_BASED_OUTPATIENT_CLINIC_OR_DEPARTMENT_OTHER): Payer: Self-pay

## 2024-03-14 DIAGNOSIS — R072 Precordial pain: Secondary | ICD-10-CM | POA: Insufficient documentation

## 2024-03-14 DIAGNOSIS — I25119 Atherosclerotic heart disease of native coronary artery with unspecified angina pectoris: Secondary | ICD-10-CM | POA: Insufficient documentation

## 2024-03-14 LAB — MYOCARDIAL PERFUSION IMAGING
Base ST Depression (mm): 0.5 mm
LV dias vol: 93 mL (ref 46–106)
LV sys vol: 33 mL
Nuc Stress EF: 64 %
Peak HR: 82 {beats}/min
Rest HR: 49 {beats}/min
Rest Nuclear Isotope Dose: 10.3 mCi
SDS: 0
SRS: 2
SSS: 2
ST Depression (mm): 0.5 mm
Stress Nuclear Isotope Dose: 32.5 mCi
TID: 0.92

## 2024-03-14 MED ORDER — REGADENOSON 0.4 MG/5ML IV SOLN
0.4000 mg | Freq: Once | INTRAVENOUS | Status: AC
  Administered 2024-03-14: 0.4 mg via INTRAVENOUS

## 2024-03-14 MED ORDER — TECHNETIUM TC 99M TETROFOSMIN IV KIT
32.5000 | PACK | Freq: Once | INTRAVENOUS | Status: AC | PRN
Start: 2024-03-14 — End: 2024-03-14
  Administered 2024-03-14: 32.5 via INTRAVENOUS

## 2024-03-14 MED ORDER — TECHNETIUM TC 99M TETROFOSMIN IV KIT
10.3000 | PACK | Freq: Once | INTRAVENOUS | Status: AC | PRN
Start: 2024-03-14 — End: 2024-03-14
  Administered 2024-03-14: 10.3 via INTRAVENOUS

## 2024-05-01 NOTE — Progress Notes (Unsigned)
 OFFICE NOTE Date:  05/02/2024  ID:  Sonia Robertson, DOB 05/12/1956, MRN [id number] PCP: Tena Feeling, MD  [facility] Providers Cardiologist:  Ola Berger, MD     Patient Profile:     Coronary artery disease MI s/p PCI to the Dx in 1987 CCTA 02/11/2021: CAC score 53 (72nd percentile), mild nonobstructive CAD [RPDA <25; LM less than 25, mid LAD 25-49, D1 <25; OM 2 proximal 25-49; normal-sized aorta Lexiscan  MPI 03/14/2024: No ischemia or infarction, low risk Hypertension Hyperlipidemia Tobacco use      Discussed the use of AI scribe software for clinical note transcription with the patient, who gave verbal consent to proceed. History of Present Illness Sonia Robertson is a 68 y.o. female who returns for follow-up of CAD.  She is a prior patient of Dr. Felipe Horton and established with Dr. Avanell Bob in November 2024.  She was recently seen 03/12/24 by Lovette Rud, PA-C with symptoms of throat burning similar to previous angina.  Nuclear stress test was arranged.  This was low risk and negative for ischemia.  She is here alone.  Since January 2025, she has been experiencing sporadic throat burning.  This was similar to symptoms before her heart attack in 1987 which prompted her previous visit. The burning occurs randomly, sometimes waking her from sleep, and is not associated with exertion. Initially, nitroglycerin  provided relief, but she later found that Tums also alleviated the symptoms. No chest discomfort, pressure, heaviness, or tightness associated with these episodes. She has had upper respiratory infection symptoms for the past week and a half, including a persistent cough and lack of energy. She has been coughing up clear sputum.  She has not had hemoptysis. She smokes and has considered quitting.   ROS-See HPI    Studies Reviewed:      Results LABS LDL: 70 (09/05/2023) ALT: 15 (09/05/2023)  Risk Assessment/Calculations:         Physical Exam:  VS:  BP 118/79   Pulse 64   Ht 5'  7" (1.702 m)   Wt 154 lb 12.8 oz (70.2 kg)   SpO2 96%   BMI 24.25 kg/m    Wt Readings from Last 3 Encounters:  05/02/24 154 lb 12.8 oz (70.2 kg)  03/14/24 156 lb (70.8 kg)  03/12/24 156 lb 9.6 oz (71 kg)    Constitutional:      Appearance: Healthy appearance. Not in distress.  Neck:     Vascular: JVD normal.  Pulmonary:     Breath sounds: Normal breath sounds. No wheezing. No rhonchi. No rales.  Cardiovascular:     Normal rate. Regular rhythm.     Murmurs: There is no murmur.  Edema:    Peripheral edema absent.  Abdominal:     Palpations: Abdomen is soft.      Assessment and Plan: Assessment & Plan Coronary artery disease involving native coronary artery of native heart with angina pectoris [facility]) She experienced a myocardial infarction in 1987, treated with PCI to the diagonal. Coronary CTA in March 2022 showed mild to moderate non-obstructive disease. Recent nuclear stress test was negative for ischemia and was low risk.  She has noted throat burning symptoms since January which are more consistent with GERD.  Her symptoms are not likely cardiac.  She was initially concerned because her presenting symptoms in 1987 included throat burning.  However, it is very reassuring that she had nonobstructive disease on the CT in 2022 and a recent low risk nuclear stress test.  We considered whether or not to pursue coronary CTA versus continued medical management.  I am not convinced that she needs further ischemic testing.  I do think she needs management for acid reflux.  No further cardiac testing is necessary at this time. - Continue amlodipine  10 mg daily - Continue aspirin 81 mg daily - Continue atenolol  12.5 mg twice daily - Continue Lipitor 40 mg daily - Continue Imdur  60 mg daily - Continue nitroglycerin  as needed - Follow up in 6 months or sooner if symptoms progress or worsen Primary hypertension Blood pressure is controlled.   - Continue amlodipine  10 mg daily, atenolol  12.5  mg twice daily, Imdur  60 mg daily. Pure hypercholesterolemia LDL is well-controlled as of October 2024. No changes in management are required. - Continue Lipitor 40 mg daily Tobacco use Cessation is recommended. Gastroesophageal reflux disease, unspecified whether esophagitis present Throat burning symptoms since January seem to be more consistent with GERD.  Symptoms are relieved by antacids.  - Start Protonix 40 mg daily for at least 6 weeks, then as needed - Refer to gastroenterology for further evaluation Viral upper respiratory tract infection Her symptoms sound consistent with viral URI.  We discussed conservative measures and when to follow-up with primary care.      Dispo:  Return in about 6 months (around 11/02/2024) for Routine Follow Up, w/ Dr. Avanell Bob. Signed, Marlyse Single, PA-C

## 2024-05-02 ENCOUNTER — Ambulatory Visit: Attending: Physician Assistant | Admitting: Physician Assistant

## 2024-05-02 ENCOUNTER — Encounter: Payer: Self-pay | Admitting: Physician Assistant

## 2024-05-02 VITALS — BP 118/79 | HR 64 | Ht 67.0 in | Wt 154.8 lb

## 2024-05-02 DIAGNOSIS — I10 Essential (primary) hypertension: Secondary | ICD-10-CM | POA: Insufficient documentation

## 2024-05-02 DIAGNOSIS — Z720 Tobacco use: Secondary | ICD-10-CM | POA: Insufficient documentation

## 2024-05-02 DIAGNOSIS — I25119 Atherosclerotic heart disease of native coronary artery with unspecified angina pectoris: Secondary | ICD-10-CM | POA: Insufficient documentation

## 2024-05-02 DIAGNOSIS — E7800 Pure hypercholesterolemia, unspecified: Secondary | ICD-10-CM | POA: Insufficient documentation

## 2024-05-02 DIAGNOSIS — K219 Gastro-esophageal reflux disease without esophagitis: Secondary | ICD-10-CM | POA: Diagnosis present

## 2024-05-02 DIAGNOSIS — J069 Acute upper respiratory infection, unspecified: Secondary | ICD-10-CM | POA: Diagnosis present

## 2024-05-02 MED ORDER — PANTOPRAZOLE SODIUM 40 MG PO TBEC: 40.0000 mg | DELAYED_RELEASE_TABLET | Freq: Every day | ORAL | 2 refills | Status: DC

## 2024-05-02 NOTE — Assessment & Plan Note (Signed)
 Blood pressure is controlled.   - Continue amlodipine  10 mg daily, atenolol  12.5 mg twice daily, Imdur  60 mg daily.

## 2024-05-02 NOTE — Assessment & Plan Note (Signed)
 LDL is well-controlled as of October 2024. No changes in management are required. - Continue Lipitor 40 mg daily

## 2024-05-02 NOTE — Assessment & Plan Note (Signed)
 She experienced a myocardial infarction in 1987, treated with PCI to the diagonal. Coronary CTA in March 2022 showed mild to moderate non-obstructive disease. Recent nuclear stress test was negative for ischemia and was low risk.  She has noted throat burning symptoms since January which are more consistent with GERD.  Her symptoms are not likely cardiac.  She was initially concerned because her presenting symptoms in 1987 included throat burning.  However, it is very reassuring that she had nonobstructive disease on the CT in 2022 and a recent low risk nuclear stress test.  We considered whether or not to pursue coronary CTA versus continued medical management.  I am not convinced that she needs further ischemic testing.  I do think she needs management for acid reflux.  No further cardiac testing is necessary at this time. - Continue amlodipine  10 mg daily - Continue aspirin 81 mg daily - Continue atenolol  12.5 mg twice daily - Continue Lipitor 40 mg daily - Continue Imdur  60 mg daily - Continue nitroglycerin  as needed - Follow up in 6 months or sooner if symptoms progress or worsen

## 2024-05-02 NOTE — Assessment & Plan Note (Signed)
 Cessation is recommended.

## 2024-05-02 NOTE — Patient Instructions (Addendum)
 Medication Instructions:  Start Protonix 40 mg once daily - take for 6 weeks, then as needed   *If you need a refill on your cardiac medications before your next appointment, please call your pharmacy*   Follow-Up: At [facility], you and your health needs are our priority.  As part of our continuing mission to provide you with exceptional heart care, our providers are all part of one team.  This team includes your primary Cardiologist (physician) and Advanced Practice Providers or APPs (Physician Assistants and Nurse Practitioners) who all work together to provide you with the care you need, when you need it.  Your next appointment:   6 month(s)  Provider:   Ola Berger, MD or Marlyse Single, PA-C          We recommend signing up for the patient portal called "MyChart".  Sign up information is provided on this After Visit Summary.  MyChart is used to connect with patients for Virtual Visits (Telemedicine).  Patients are able to view lab/test results, encounter notes, upcoming appointments, etc.  Non-urgent messages can be sent to your provider as well.   To learn more about what you can do with MyChart, go to [email or web address].   Other Instructions We have referred you to gastroenterology to evaluate for possible acid reflux

## 2024-05-22 ENCOUNTER — Encounter: Payer: Self-pay | Admitting: Gastroenterology

## 2024-07-16 ENCOUNTER — Telehealth: Payer: Self-pay | Admitting: Internal Medicine

## 2024-07-16 ENCOUNTER — Telehealth: Payer: Self-pay

## 2024-07-16 ENCOUNTER — Ambulatory Visit (INDEPENDENT_AMBULATORY_CARE_PROVIDER_SITE_OTHER): Admitting: Gastroenterology

## 2024-07-16 ENCOUNTER — Encounter: Payer: Self-pay | Admitting: Gastroenterology

## 2024-07-16 VITALS — BP 118/80 | HR 61 | Ht 65.75 in | Wt 153.4 lb

## 2024-07-16 DIAGNOSIS — Z720 Tobacco use: Secondary | ICD-10-CM | POA: Diagnosis not present

## 2024-07-16 DIAGNOSIS — Z860100 Personal history of colon polyps, unspecified: Secondary | ICD-10-CM

## 2024-07-16 DIAGNOSIS — R070 Pain in throat: Secondary | ICD-10-CM | POA: Diagnosis not present

## 2024-07-16 DIAGNOSIS — Z860102 Personal history of hyperplastic colon polyps: Secondary | ICD-10-CM | POA: Diagnosis not present

## 2024-07-16 MED ORDER — PANTOPRAZOLE SODIUM 40 MG PO TBEC: 40.0000 mg | DELAYED_RELEASE_TABLET | Freq: Every day | ORAL | 2 refills | Status: DC

## 2024-07-16 NOTE — Telephone Encounter (Signed)
 Request for surgical clearance:     Endoscopy Procedure  What type of surgery is being performed?     EGD  When is this surgery scheduled?     TBD  What type of clearance is required ?   Cardiac Clearance  Are there any medications that need to be held prior to surgery and how long? No medication.   Practice name and name of physician performing surgery?      [facility]  What is your office phone and fax number?      Phone- [phone]  Fax- [phone]  Anesthesia type (None, local, MAC, general) ?       MAC  Please route your response to Blondie Barks, CMA

## 2024-07-16 NOTE — Telephone Encounter (Signed)
 Med rec and consent done.    Patient Consent for Virtual Visit        Sonia Robertson has provided verbal consent on 07/16/2024 for a virtual visit (video or telephone).   CONSENT FOR VIRTUAL VISIT FOR:  Sonia Robertson  By participating in this virtual visit I agree to the following:  I hereby voluntarily request, consent and authorize [facility] and its employed or contracted physicians, physician assistants, nurse practitioners or other licensed health care professionals (the Practitioner), to provide me with telemedicine health care services (the "Services) as deemed necessary by the treating Practitioner. I acknowledge and consent to receive the Services by the Practitioner via telemedicine. I understand that the telemedicine visit will involve communicating with the Practitioner through live audiovisual communication technology and the disclosure of certain medical information by electronic transmission. I acknowledge that I have been given the opportunity to request an in-person assessment or other available alternative prior to the telemedicine visit and am voluntarily participating in the telemedicine visit.  I understand that I have the right to withhold or withdraw my consent to the use of telemedicine in the course of my care at any time, without affecting my right to future care or treatment, and that the Practitioner or I may terminate the telemedicine visit at any time. I understand that I have the right to inspect all information obtained and/or recorded in the course of the telemedicine visit and may receive copies of available information for a reasonable fee.  I understand that some of the potential risks of receiving the Services via telemedicine include:  Delay or interruption in medical evaluation due to technological equipment failure or disruption; Information transmitted may not be sufficient (e.g. poor resolution of images) to allow for appropriate medical  decision making by the Practitioner; and/or  In rare instances, security protocols could fail, causing a breach of personal health information.  Furthermore, I acknowledge that it is my responsibility to provide information about my medical history, conditions and care that is complete and accurate to the best of my ability. I acknowledge that Practitioner's advice, recommendations, and/or decision may be based on factors not within their control, such as incomplete or inaccurate data provided by me or distortions of diagnostic images or specimens that may result from electronic transmissions. I understand that the practice of medicine is not an exact science and that Practitioner makes no warranties or guarantees regarding treatment outcomes. I acknowledge that a copy of this consent can be made available to me via my patient portal [facility] MyChart), or I can request a printed copy by calling the office of [facility].    I understand that my insurance will be billed for this visit.   I have read or had this consent read to me. I understand the contents of this consent, which adequately explains the benefits and risks of the Services being provided via telemedicine.  I have been provided ample opportunity to ask questions regarding this consent and the Services and have had my questions answered to my satisfaction. I give my informed consent for the services to be provided through the use of telemedicine in my medical care

## 2024-07-16 NOTE — Telephone Encounter (Signed)
   Name: Sonia Robertson  DOB: 06-Aug-1956  MRN: [id number]  Primary Cardiologist: Vina Gull, MD   Preoperative team, please contact this patient and set up a phone call appointment for further preoperative risk assessment. Please obtain consent and complete medication review. Thank you for your help.  I confirm that guidance regarding antiplatelet and oral anticoagulation therapy has been completed and, if necessary, noted below.  Surgeon did not request holding medication. She is on ASA  I also confirmed the patient resides in the state of [place] . As per [facility] Medical Board telemedicine laws, the patient must reside in the state in which the provider is licensed.   Lamarr Satterfield, NP 07/16/2024, 3:22 PM [facility]

## 2024-07-16 NOTE — Telephone Encounter (Signed)
 Called patient, spoke with husband, he will have patient call back to schedule TELE appointment.

## 2024-07-16 NOTE — Telephone Encounter (Signed)
 Patient returned Pre-op call.

## 2024-07-16 NOTE — Telephone Encounter (Signed)
 S/W pt and scheduled TELE Preop appt 07/29/24. Med Rec and Consent done

## 2024-07-16 NOTE — Patient Instructions (Signed)
 It has been recommended to you by your physician that you have a(n) EGD completed. Per your request, we did not schedule the procedure(s) today. Please contact our office at [phone] should you decide to have the procedure completed. You will be scheduled for a pre-visit and procedure at that time.  We have sent the following medications to your pharmacy for you to pick up at your convenience: Protonix    _______________________________________________________  If your blood pressure at your visit was 140/90 or greater, please contact your primary care physician to follow up on this.  _______________________________________________________  If you are age 12 or older, your body mass index should be between 23-30. Your Body mass index is 24.95 kg/m. If this is out of the aforementioned range listed, please consider follow up with your Primary Care Provider.  If you are age 63 or younger, your body mass index should be between 19-25. Your Body mass index is 24.95 kg/m. If this is out of the aformentioned range listed, please consider follow up with your Primary Care Provider.   ________________________________________________________  The [facility] providers would like to encourage you to use MYCHART to communicate with providers for non-urgent requests or questions.  Due to long hold times on the telephone, sending your provider a message by [facility] may be a faster and more efficient way to get a response.  Please allow 48 business hours for a response.  Please remember that this is for non-urgent requests.  _______________________________________________________  [facility] is using a team-based approach to care.  Your team is made up of your doctor and two to three APPS. Our APPS (Nurse Practitioners and Physician Assistants) work with your physician to ensure care continuity for you. They are fully qualified to address your health concerns and develop a treatment plan. They  communicate directly with your gastroenterologist to care for you. Seeing the Advanced Practice Practitioners on your physician's team can help you by facilitating care more promptly, often allowing for earlier appointments, access to diagnostic testing, procedures, and other specialty referrals.   Thank you for choosing me and [facility].  Camie Furbish, PA-C

## 2024-07-16 NOTE — Progress Notes (Addendum)
 Sonia Robertson [id number] Apr 03, 1956   Chief Complaint: GERD  Referring Provider: Dwight Trula SQUIBB, MD Primary GI MD: Sampson  HPI: Sonia Robertson is a 68 y.o. female with past medical history of HTN, hypothyroidism, CAD, MI 77, thyroid  disease, hysterectomy who presents today for a complaint of GERD.    Follows with cardiology, last seen 05/02/2024 at which time she noted throat burning symptoms ongoing since January.  Symptoms thought likely not cardiac in nature.  Had nonobstructive disease on CT in 2022 and recent low risk nuclear stress test.  She was started on Protonix  40 mg daily for at least 6 weeks and referred to GI for further evaluation.   Patient states that in April she had sensation of burning in her throat.  She took nitroglycerin  and it went away.  The only other time she had this sensation of burning in her throat was when she had Robertson MI many years ago so this concerned her and she told her cardiologist.  As above, they did not think her symptoms were cardiac in nature.  Since then she has had intermittent throat burning which seems to occur more at night.  Otherwise denies any clear pattern to her symptoms and not necessarily related to eating.  She takes Tums when this occurs and it does help.  Did take Protonix  in the morning for 6 weeks but did not really notice any improvement on Protonix .  Now just taking this intermittently but can still have breakthrough symptoms.  Throat burning feels completely random to her, she is not sure what causes it.  She has occasional nausea but denies any vomiting, fever, chills, abdominal pain.  Denies dysphagia.  She does have a smoking history, continues to smoke.  Prior to April had no heartburn or acid reflux.  Denies any blood in her stool or melena.  She believes she had a colonoscopy since 2017, possibly at [facility].  Denies lung problems but has Robertson inhaler she uses as needed.  Previous GI Procedures/Imaging    Colonoscopy 08/14/2016 (Dr. Vicci) - Three 4 mm polyps in the rectum, removed with a cold snare. Resected and retrieved.  - The examination was otherwise normal. - Recall 5 years Path: Rectum, polyp(s) - HYPERPLASTIC POLYP(S). - THERE IS NO EVIDENCE OF MALIGNANCY.   Past Medical History:  Diagnosis Date   Adopted    pt is adopted   Angina    NONE SINCE MARCH   SEES DR H SMITH  -LOV 6'17 Epic.-, 9-8-17remains with sporadic episodes of chest pain relived with NTG use_ Dr. Claudene aware.   Arthritis    thinks arthritis in neck   Fibroid    HSV-1 infection    Hypertension    Hypothyroidism    Myocardial infarction (HCC)    1988   Shortness of breath    WITH EXERTION   Thyroid  disease     Past Surgical History:  Procedure Laterality Date   ABDOMINAL HYSTERECTOMY  2004   TAH BSO   BACK SURGERY     fusion lumbar   CARDIAC CATHETERIZATION     COLONOSCOPY WITH PROPOFOL  N/A 08/14/2016   Procedure: COLONOSCOPY WITH PROPOFOL ;  Surgeon: Gladis MARLA Vicci, MD;  Location: [facility];  Service: Endoscopy;  Laterality: N/A;   KNEE SURGERY     Arthroscopic   OOPHORECTOMY     BSO    Current Outpatient Medications  Medication Sig Dispense Refill   albuterol  (VENTOLIN  HFA) 108 (90 Base) MCG/ACT inhaler Inhale into  the lungs.     amLODipine  (NORVASC ) 10 MG tablet Take 10 mg by mouth daily.     Ascorbic Acid (VITAMIN C PO) Take by mouth.     aspirin EC 81 MG tablet Take 1 tablet (81 mg total) by mouth daily.     atenolol  (TENORMIN ) 25 MG tablet Take 25 mg by mouth daily. Takes 1/2 tablet(12.5 mg) daily AM/ 1/2 tablet (12.5 mg) daily PM = 25 mg daily total     atorvastatin  (LIPITOR) 40 MG tablet TAKE 1 TABLET BY MOUTH EVERY DAY 90 tablet 3   Coenzyme Q10 (COQ10) 200 MG CAPS Take 1 capsule by mouth at bedtime.      ibuprofen (ADVIL,MOTRIN) 200 MG tablet Take 800 mg by mouth every 8 (eight) hours as needed. Pain/ swelling.  Only if she has not taken Diclofenac.     isosorbide   mononitrate (IMDUR ) 60 MG 24 hr tablet TAKE 1 TABLET(60 MG) BY MOUTH DAILY 90 tablet 2   levothyroxine  (SYNTHROID ) 112 MCG tablet Take 112 mcg by mouth daily before breakfast.     Multiple Vitamin (MULTIVITAMIN WITH MINERALS) TABS Take 1 tablet by mouth daily.     nitroGLYCERIN  (NITROSTAT ) 0.4 MG SL tablet Place 1 tablet (0.4 mg total) under the tongue every 5 (five) minutes as needed for chest pain. 25 tablet 7   pantoprazole  (PROTONIX ) 40 MG tablet Take 1 tablet (40 mg total) by mouth daily. Take for 6 weeks, the take as needed for indigestion. 30 tablet 2   Vitamin D, Cholecalciferol, 1000 UNITS TABS Take 1 tablet by mouth daily at 6 (six) AM.     Zinc 15 MG CAPS Take 1 capsule by mouth daily.     No current facility-administered medications for this visit.    Allergies as of 07/16/2024 - Review Complete 05/02/2024  Allergen Reaction Noted   Morphine and codeine Nausea And Vomiting 10/04/2011    Family History  Adopted: Yes  Problem Relation Age of Onset   Hypertension Mother     Social History   Tobacco Use   Smoking status: Every Day    Current packs/day: 0.50    Types: Cigarettes   Smokeless tobacco: Never  Vaping Use   Vaping status: Never Used  Substance Use Topics   Alcohol use: No    Alcohol/week: 0.0 standard drinks of alcohol   Drug use: No     Review of Systems:    Constitutional: No weight loss, fever, chills Cardiovascular: No chest pain Respiratory: No SOB  Gastrointestinal: See HPI and otherwise negative Hematologic: No bleeding or bruising    Physical Exam:  Vital signs: BP 118/80 (BP Location: Left Arm, Patient Position: Sitting, Cuff Size: Normal)   Pulse 61   Ht 5' 5.75 (1.67 m) Comment: height wihtout shoes  Wt 153 lb 6 oz (69.6 kg)   BMI 24.95 kg/m    Constitutional: Pleasant female in NAD, alert and cooperative Head:  Normocephalic and atraumatic.  Eyes: No scleral icterus.  Respiratory: Respirations even and unlabored. Lungs clear to  auscultation bilaterally.  No wheezes, crackles, or rhonchi.  Cardiovascular:  Regular rate and rhythm. No murmurs. No peripheral edema. Gastrointestinal:  Soft, nondistended, nontender. No rebound or guarding. Normal bowel sounds. No appreciable masses or hepatomegaly. Rectal:  Not performed.  Neurologic:  Alert and oriented x4;  grossly normal neurologically.  Skin:   Dry and intact without significant lesions or rashes. Psychiatric: Oriented to person, place and time. Demonstrates good judgement and reason without abnormal affect  or behaviors.   RELEVANT LABS AND IMAGING: CBC    Component Value Date/Time   WBC 6.5 07/02/2020 0739   WBC 6.9 05/02/2012 1618   RBC 4.88 07/02/2020 0739   RBC 4.71 05/02/2012 1618   HGB 14.3 07/02/2020 0739   HCT 43.7 07/02/2020 0739   PLT 155 07/02/2020 0739   MCV 90 07/02/2020 0739   MCH 29.3 07/02/2020 0739   MCH 32.1 05/02/2012 1618   MCHC 32.7 07/02/2020 0739   MCHC 34.6 05/02/2012 1618   RDW 12.8 07/02/2020 0739    CMP     Component Value Date/Time   NA 141 02/04/2021 0959   K 3.9 02/04/2021 0959   CL 103 02/04/2021 0959   CO2 24 02/04/2021 0959   GLUCOSE 96 02/04/2021 0959   GLUCOSE 92 05/02/2012 1618   BUN 13 02/04/2021 0959   CREATININE 0.68 02/04/2021 0959   CALCIUM  9.5 02/04/2021 0959   PROT 6.4 07/02/2020 0739   ALBUMIN 4.5 07/02/2020 0739   AST 15 07/02/2020 0739   ALT 17 07/02/2020 0739   ALKPHOS 83 07/02/2020 0739   BILITOT 0.7 07/02/2020 0739   GFRNONAA 93 07/02/2020 0739   GFRAA 108 07/02/2020 0739     Assessment/Plan:   Throat burning Tobacco use Patient here today for complaint of intermittent burning sensation in her throat which has been ongoing since April.  No prior EGD.  The only time she has experienced this in the past was many years ago when she had Robertson MI, and because of this has been evaluated by cardiology.  They do not think her symptoms are cardiac in nature and had started her on Protonix  with  referral to GI for further evaluation. She is now just taking Protonix  as needed, not sure if it was helpful, but she does have improvement of her throat burning sensation when she takes Tums.  - Schedule EGD. I thoroughly discussed the procedure with the patient to include nature of the procedure, alternatives, benefits, and risks (including but not limited to bleeding, infection, perforation, anesthesia/cardiac/pulmonary complications). Patient verbalized understanding and gave verbal consent to proceed with procedure.  - Will obtain cardiac clearance for procedure. - Recommend to take Protonix  40 mg daily until evaluation during EGD - Request recent PCP labs  History of colon polyps Hyperplastic polyp seen on colonoscopy in 2017 with 5-year recall recommended.  She thinks she may have had a more recent colonoscopy done at [facility] and will reach out to them to confirm this.   Camie Furbish, PA-C [facility] 07/16/2024, 12:23 PM  Patient Care Team: Dwight Trula SQUIBB, MD as PCP - General (Internal Medicine) Okey Vina GAILS, MD as PCP - Cardiology (Cardiology) Floy Lynwood PARAS, MD (Inactive) as Referring Physician (Otolaryngology)     Addendum 07/31/2024:  PCP labs received:  Labs 09/2023: Total bilirubin 1.2 otherwise unremarkable CMP, normal lipid panel, normal CBC with hemoglobin 15.5  Normal TSH 10/2023

## 2024-07-17 ENCOUNTER — Encounter: Payer: Self-pay | Admitting: Internal Medicine

## 2024-07-17 ENCOUNTER — Encounter: Payer: Self-pay | Admitting: Gastroenterology

## 2024-07-17 ENCOUNTER — Telehealth: Payer: Self-pay | Admitting: Gastroenterology

## 2024-07-17 NOTE — Telephone Encounter (Signed)
 Noted. Routing to [employer] as FYI.

## 2024-07-17 NOTE — Telephone Encounter (Signed)
 Patient called and stated that she would like to schedule her EGD and pt was scheduled for Sep the 10 th and the pre-visit in August the 27 th. Patient also wanted me to inform Camie she had a colonoscopy with Dr. Burnette back in April 26 th of 2023. Please advise.

## 2024-07-21 NOTE — Telephone Encounter (Signed)
 Patient has been scheduled for EGD 08/13/24 with Dr Lupita Commander. Has previsit with nurse on 07/30/24.

## 2024-07-29 ENCOUNTER — Encounter: Payer: Self-pay | Admitting: Nurse Practitioner

## 2024-07-29 ENCOUNTER — Ambulatory Visit: Attending: Cardiology | Admitting: Nurse Practitioner

## 2024-07-29 DIAGNOSIS — Z01810 Encounter for preprocedural cardiovascular examination: Secondary | ICD-10-CM | POA: Diagnosis not present

## 2024-07-29 NOTE — Progress Notes (Signed)
 Virtual Visit via Telephone Note   Because of Sonia Robertson co-morbid illnesses, she is at least at moderate risk for complications without adequate follow up.  This format is felt to be most appropriate for this patient at this time.  Due to technical limitations with video connection (technology), today'Robertson appointment will be conducted as an audio only telehealth visit, and Sonia Robertson verbally agreed to proceed in this manner.   All issues noted in this document were discussed and addressed.  No physical exam could be performed with this format.  Evaluation Performed:  Preoperative cardiovascular risk assessment _____________   Date:  07/29/2024   Patient ID:  Sonia Robertson, DOB [birth year], MRN [id number] Patient Location:  Home Provider location:   Office  Primary Care Provider:  Dwight Trula SQUIBB, MD Primary Cardiologist:  Sonia Gull, MD  Chief Complaint / Patient Profile   68 y.o. y/o female with a Sonia/o CAD Robertson/p PCI to Dx in 1987, CCTA 02/11/21 mild nonobstructive CAD, lexiscan  MPI 03/14/24 low risk, current tobacco use, HTN, HLD, tobacco use who is pending EGD on date TBD and presents today for telephonic preoperative cardiovascular risk assessment.  History of Present Illness    Sonia Robertson is a 68 y.o. female who presents via audio/video conferencing for a telehealth visit today.  Pt was last seen in cardiology clinic on 05/02/24 by Sonia Ferrier, PA.  At that time Sonia Robertson was doing well.  The patient is now pending procedure as outlined above. Since her last visit, she denies shortness of breath, lower extremity edema, fatigue, palpitations, melena, hematuria, hemoptysis, diaphoresis, weakness, presyncope, syncope, orthopnea, and PND. She is having burning in her chest, which prompted cardiology visits in April and May. She had a low risk lexiscan  myoview  on 03/14/24 and is now seeking diagnosis through EGD. She is able to complete > 4 METS activity without concerning cardiac  symptoms.   Past Medical History    Past Medical History:  Diagnosis Date   Adopted    pt is adopted   Angina    NONE SINCE MARCH   SEES DR Sonia Robertson  -LOV 6'17 Epic.-, 9-8-17remains with sporadic episodes of chest pain relived with NTG use_ Sonia Robertson aware.   Arthritis    thinks arthritis in neck   Fibroid    HSV-1 infection    Hypertension    Hypothyroidism    Myocardial infarction (HCC)    1988   Shortness of breath    WITH EXERTION   Thyroid  disease    Past Surgical History:  Procedure Laterality Date   ABDOMINAL HYSTERECTOMY  2004   TAH BSO   BACK SURGERY     fusion lumbar   CARDIAC CATHETERIZATION     COLONOSCOPY WITH PROPOFOL  N/A 08/14/2016   Procedure: COLONOSCOPY WITH PROPOFOL ;  Surgeon: Sonia MARLA Louder, MD;  Location: [facility];  Service: Endoscopy;  Laterality: N/A;   KNEE SURGERY     Arthroscopic   OOPHORECTOMY     BSO    Allergies  Allergies  Allergen Reactions   Morphine And Codeine Nausea And Vomiting    Home Medications    Prior to Admission medications   Medication Sig Start Date End Date Taking? Authorizing Provider  albuterol  (VENTOLIN  HFA) 108 (90 Base) MCG/ACT inhaler Inhale into the lungs.    [provider]  amLODipine  (NORVASC ) 10 MG tablet Take 10 mg by mouth daily.    [provider]  Ascorbic Acid (VITAMIN C  PO) Take by mouth.    [provider]  aspirin EC 81 MG tablet Take 1 tablet (81 mg total) by mouth daily. 04/16/17   Sonia Victory ORN, MD  atenolol  (TENORMIN ) 25 MG tablet Take 25 mg by mouth daily. Takes 1/2 tablet(12.5 mg) daily AM/ 1/2 tablet (12.5 mg) daily PM = 25 mg daily total    [provider]  atorvastatin  (LIPITOR) 40 MG tablet TAKE 1 TABLET BY MOUTH EVERY DAY 08/04/20   Walker, Caitlin S, NP  Coenzyme Q10 (COQ10) 200 MG CAPS Take 1 capsule by mouth at bedtime.     [provider]  ibuprofen (ADVIL,MOTRIN) 200 MG tablet Take 800 mg by mouth every 8 (eight) hours as needed.  Pain/ swelling.  Only if she has not taken Diclofenac.    [provider]  isosorbide  mononitrate (IMDUR ) 60 MG 24 hr tablet TAKE 1 TABLET(60 MG) BY MOUTH DAILY 09/11/22   Sonia Victory ORN, MD  levothyroxine  (SYNTHROID ) 112 MCG tablet Take 112 mcg by mouth daily before breakfast.    [provider]  Multiple Vitamin (MULTIVITAMIN WITH MINERALS) TABS Take 1 tablet by mouth daily.    [provider]  nitroGLYCERIN  (NITROSTAT ) 0.4 MG SL tablet Place 1 tablet (0.4 mg total) under the tongue every 5 (five) minutes as needed for chest pain. 12/19/22   Sonia Victory ORN, MD  pantoprazole  (PROTONIX ) 40 MG tablet Take 1 tablet (40 mg total) by mouth daily. 07/16/24   Heinz, Sara E, PA-C  Tiotropium Bromide-Olodaterol (STIOLTO RESPIMAT) 2.5-2.5 MCG/ACT AERS 2 puffs Inhalation Once a day; Duration: 30 days 12/04/23   [provider]  Vitamin D, Cholecalciferol, 1000 UNITS TABS Take 1 tablet by mouth daily at 6 (six) AM.    [provider]  Zinc 15 MG CAPS Take 1 capsule by mouth daily.    [provider]    Physical Exam    Vital Signs:  Sonia Robertson does not have vital signs available for review today.  Given telephonic nature of communication, physical exam is limited. AAOx3. NAD. Normal affect.  Speech and respirations are unlabored.  Accessory Clinical Findings    None  Assessment & Plan    1.  Preoperative Cardiovascular Risk Assessment: According to the Revised Cardiac Risk Index (RCRI), her Perioperative Risk of Major Cardiac Event is (%): 0.9. Her Functional Capacity in METs is: 8.23 according to the Duke Activity Status Index (DASI). The patient is doing well from a cardiac perspective. Therefore, based on ACC/AHA guidelines, the patient would be at acceptable risk for the planned procedure without further cardiovascular testing.   The patient was advised that if she develops new symptoms prior to surgery to contact our office to arrange for a  follow-up visit, and she verbalized understanding.  Per office protocol, she may hold aspirin for 7 days prior to procedure and should resume as soon as hemodynamically stable postoperatively.  A copy of this note will be routed to requesting surgeon.  Time:   Today, I have spent 10 minutes with the patient with telehealth technology discussing medical history, symptoms, and management plan.     Rosaline EMERSON Bane, NP-C  07/29/2024, 10:09 AM [address] [place], [place] [address] Office [phone] Fax [phone]

## 2024-07-30 ENCOUNTER — Ambulatory Visit (AMBULATORY_SURGERY_CENTER): Admitting: *Deleted

## 2024-07-30 VITALS — Ht 67.0 in | Wt 154.0 lb

## 2024-07-30 DIAGNOSIS — R070 Pain in throat: Secondary | ICD-10-CM

## 2024-07-30 NOTE — Progress Notes (Signed)
 Pre visit completed over telephone. Instructions forwarded through MyChart   No egg or soy allergy known to patient  No issues known to pt with past sedation with any surgeries or procedures Patient denies ever being told they had issues or difficulty with intubation  No FH of Malignant Hyperthermia Pt is not on diet pills Pt is not on  home 02  Pt is not on blood thinners  Pt denies issues with constipation  No A fib or A flutter Have any cardiac testing pending-- NO Pt instructed to use Single[employer]com or GoodRx for a price reduction on prep    Patient was cleared by Cardiology.to have procedure.

## 2024-08-07 NOTE — Telephone Encounter (Signed)
 Per Cardiology patient is doing well from Cardiac Perspective . No further cardiovascular testing needed. Okay to proceed with EGD.

## 2024-08-12 NOTE — Progress Notes (Unsigned)
 [facility] History and Physical   Primary Care Physician:  Dwight Trula SQUIBB, MD   Reason for Procedure:    Encounter Diagnosis  Name Primary?   Throat burning Yes     Plan:    EGD     HPI: Sonia Robertson is a 68 y.o. female with persistent intermittent throat burning and negative cardiac workup, presenting for evaluation to look for possible GERD.  She has been placed on PPI she was seen in the clinic by Camie Furbish PA-C in August.  Please see that note for further details.   Past Medical History:  Diagnosis Date   Adopted    pt is adopted   Angina    NONE SINCE MARCH   SEES DR H SMITH  -LOV 6'17 Epic.-, 9-8-17remains with sporadic episodes of chest pain relived with NTG use_ Dr. Claudene aware.   Arthritis    thinks arthritis in neck   Fibroid    HSV-1 infection    Hyperlipidemia    Hypertension    Hypothyroidism    Myocardial infarction (HCC)    1988   Shortness of breath    WITH EXERTION   Thyroid  disease     Past Surgical History:  Procedure Laterality Date   ABDOMINAL HYSTERECTOMY  2004   TAH BSO   BACK SURGERY     fusion lumbar   CARDIAC CATHETERIZATION     COLONOSCOPY WITH PROPOFOL  N/A 08/14/2016   Procedure: COLONOSCOPY WITH PROPOFOL ;  Surgeon: Gladis MARLA Louder, MD;  Location: [facility];  Service: Endoscopy;  Laterality: N/A;   KNEE SURGERY     Arthroscopic   OOPHORECTOMY     BSO     Current Outpatient Medications  Medication Sig Dispense Refill   albuterol  (VENTOLIN  HFA) 108 (90 Base) MCG/ACT inhaler Inhale into the lungs. (Patient not taking: Reported on 07/30/2024)     amLODipine  (NORVASC ) 10 MG tablet Take 10 mg by mouth daily.     Ascorbic Acid (VITAMIN C PO) Take by mouth.     aspirin EC 81 MG tablet Take 1 tablet (81 mg total) by mouth daily.     atenolol  (TENORMIN ) 25 MG tablet Take 25 mg by mouth daily. Takes 1/2 tablet(12.5 mg) daily AM/ 1/2 tablet (12.5 mg) daily PM = 25 mg daily total     atorvastatin  (LIPITOR) 40 MG  tablet TAKE 1 TABLET BY MOUTH EVERY DAY 90 tablet 3   Coenzyme Q10 (COQ10) 200 MG CAPS Take 1 capsule by mouth at bedtime.      ibuprofen (ADVIL,MOTRIN) 200 MG tablet Take 800 mg by mouth every 8 (eight) hours as needed. Pain/ swelling.  Only if she has not taken Diclofenac. (Patient not taking: Reported on 07/30/2024)     isosorbide  mononitrate (IMDUR ) 60 MG 24 hr tablet TAKE 1 TABLET(60 MG) BY MOUTH DAILY 90 tablet 2   levothyroxine  (SYNTHROID ) 112 MCG tablet Take 112 mcg by mouth daily before breakfast.     Multiple Vitamin (MULTIVITAMIN WITH MINERALS) TABS Take 1 tablet by mouth daily.     nitroGLYCERIN  (NITROSTAT ) 0.4 MG SL tablet Place 1 tablet (0.4 mg total) under the tongue every 5 (five) minutes as needed for chest pain. 25 tablet 7   pantoprazole  (PROTONIX ) 40 MG tablet Take 1 tablet (40 mg total) by mouth daily. 30 tablet 2   Tiotropium Bromide-Olodaterol (STIOLTO RESPIMAT) 2.5-2.5 MCG/ACT AERS 2 puffs Inhalation Once a day; Duration: 30 days (Patient not taking: Reported on 07/30/2024)     Vitamin D,  Cholecalciferol, 1000 UNITS TABS Take 1 tablet by mouth daily at 6 (six) AM.     Zinc 15 MG CAPS Take 1 capsule by mouth daily.     No current facility-administered medications for this visit.    Allergies as of 08/13/2024 - Review Complete 07/30/2024  Allergen Reaction Noted   Morphine Nausea And Vomiting 07/30/2024    Family History  Adopted: Yes  Problem Relation Age of Onset   Hypertension Mother    Diabetes Daughter     Social History   Socioeconomic History   Marital status: Married    Spouse name: Not on file   Number of children: Not on file   Years of education: Not on file   Highest education level: Not on file  Occupational History   Not on file  Tobacco Use   Smoking status: Every Day    Current packs/day: 0.50    Types: Cigarettes   Smokeless tobacco: Never  Vaping Use   Vaping status: Never Used  Substance and Sexual Activity   Alcohol use: No     Alcohol/week: 0.0 standard drinks of alcohol   Drug use: No   Sexual activity: Yes    Partners: Male    Birth control/protection: Surgical    Comment: 1ST INTERCOURSE- 13, PARTNERS- GREATER THAN 5  Other Topics Concern   Not on file  Social History Narrative   Not on file   Social Drivers of Health   Financial Resource Strain: Not on file  Food Insecurity: Not on file  Transportation Needs: Not on file  Physical Activity: Not on file  Stress: Not on file  Social Connections: Not on file  Intimate Partner Violence: Not on file    Review of Systems: Positive for *** All other review of systems negative except as mentioned in the HPI.  Physical Exam: Vital signs There were no vitals taken for this visit.  General:   Alert,  Well-developed, well-nourished, pleasant and cooperative in NAD Lungs:  Clear throughout to auscultation.   Heart:  Regular rate and rhythm; no murmurs, clicks, rubs,  or gallops. Abdomen:  Soft, nontender and nondistended. Normal bowel sounds.   Neuro/Psych:  Alert and cooperative. Normal mood and affect. A and O x 3   @Latia Mataya  CHARLENA Commander, MD, [facility] [phone] (pager) 08/12/2024 5:50 PM@

## 2024-08-13 ENCOUNTER — Ambulatory Visit (AMBULATORY_SURGERY_CENTER): Admitting: Internal Medicine

## 2024-08-13 ENCOUNTER — Encounter: Payer: Self-pay | Admitting: Internal Medicine

## 2024-08-13 VITALS — BP 112/75 | HR 45 | Temp 97.7°F | Resp 19 | Ht 65.0 in | Wt 154.0 lb

## 2024-08-13 DIAGNOSIS — K2950 Unspecified chronic gastritis without bleeding: Secondary | ICD-10-CM

## 2024-08-13 DIAGNOSIS — K2289 Other specified disease of esophagus: Secondary | ICD-10-CM

## 2024-08-13 DIAGNOSIS — K3189 Other diseases of stomach and duodenum: Secondary | ICD-10-CM | POA: Diagnosis not present

## 2024-08-13 DIAGNOSIS — R070 Pain in throat: Secondary | ICD-10-CM

## 2024-08-13 DIAGNOSIS — K5710 Diverticulosis of small intestine without perforation or abscess without bleeding: Secondary | ICD-10-CM

## 2024-08-13 DIAGNOSIS — K219 Gastro-esophageal reflux disease without esophagitis: Secondary | ICD-10-CM

## 2024-08-13 MED ORDER — SODIUM CHLORIDE 0.9 % IV SOLN: 500.0000 mL | Freq: Once | INTRAVENOUS | Status: DC

## 2024-08-13 NOTE — Progress Notes (Signed)
 Report to PACU, RN, vss, BBS= Clear.

## 2024-08-13 NOTE — Patient Instructions (Addendum)
 The stomach looks irritated or inflamed.  I do not think that would be related to your symptoms but I checked this with biopsies to see if there is an infection.  I also took biopsies in the esophagus to see if that would help me understand why you have this throat burning.  I am not convinced it is acid reflux though it could be.  There is a little pouch in your upper intestine called a diverticulum that should most likely never cause you any problems.  Once I get the results we will contact you.  Depending upon what is seen on the pathology it could mean different medication regimen or other testing to try to understand this.  I do not think there is anything harmful to your health going on though I understand this bothers you significantly.  At least Tums relieves it.  I do think it would be useful to try taking Gaviscon at bedtime you can either get tablets or the liquid version and take a standard dose as outlined on the package.  I also recommend you raise the head of your bed with blocks or get a wedge pillow to sleep on to see if that helps.  I appreciate the opportunity to care for you. Lupita CHARLENA Commander, MD, FACG  YOU HAD AN ENDOSCOPIC PROCEDURE TODAY AT THE [facility]:   Refer to the procedure report that was given to you for any specific questions about what was found during the examination.  If the procedure report does not answer your questions, please call your gastroenterologist to clarify.  If you requested that your care partner not be given the details of your procedure findings, then the procedure report has been included in a sealed envelope for you to review at your convenience later.  YOU SHOULD EXPECT: Some feelings of bloating in the abdomen. Passage of more gas than usual.  Walking can help get rid of the air that was put into your GI tract during the procedure and reduce the bloating. If you had a lower endoscopy (such as a colonoscopy or flexible  sigmoidoscopy) you may notice spotting of blood in your stool or on the toilet paper. If you underwent a bowel prep for your procedure, you may not have a normal bowel movement for a few days.  Please Note:  You might notice some irritation and congestion in your nose or some drainage.  This is from the oxygen used during your procedure.  There is no need for concern and it should clear up in a day or so.  SYMPTOMS TO REPORT IMMEDIATELY:   Following upper endoscopy (EGD)  Vomiting of blood or coffee ground material  New chest pain or pain under the shoulder blades  Painful or persistently difficult swallowing  New shortness of breath  Fever of 100F or higher  Black, tarry-looking stools  For urgent or emergent issues, a gastroenterologist can be reached at any hour by calling [phone]. Do not use MyChart messaging for urgent concerns.    DIET:  We do recommend a small meal at first, but then you may proceed to your regular diet.  Drink plenty of fluids but you should avoid alcoholic beverages for 24 hours.  ACTIVITY:  You should plan to take it easy for the rest of today and you should NOT DRIVE or use heavy machinery until tomorrow (because of the sedation medicines used during the test).    FOLLOW UP: Our staff will call the number listed  on your records the next business day following your procedure.  We will call around 7:15- 8:00 am to check on you and address any questions or concerns that you may have regarding the information given to you following your procedure. If we do not reach you, we will leave a message.     If any biopsies were taken you will be contacted by phone or by letter within the next 1-3 weeks.  Please call us  at [phone] if you have not heard about the biopsies in 3 weeks.    SIGNATURES/CONFIDENTIALITY: You and/or your care partner have signed paperwork which will be entered into your electronic medical record.  These signatures attest to the fact  that that the information above on your After Visit Summary has been reviewed and is understood.  Full responsibility of the confidentiality of this discharge information lies with you and/or your care-partner.

## 2024-08-13 NOTE — Progress Notes (Signed)
 Called to room to assist during endoscopic procedure.  Patient ID and intended procedure confirmed with present staff. Received instructions for my participation in the procedure from the performing physician.

## 2024-08-13 NOTE — Op Note (Addendum)
 [facility] Patient Name: Sonia Robertson Procedure Date: 08/13/2024 9:47 AM MRN: [id number] Endoscopist: Lupita FORBES Commander , MD, [id number] Age: 68 Referring MD:  Date of Birth: 07-Oct-1956 Gender: Female Account #: [id number] Procedure:                Upper GI endoscopy Indications:              Esophageal reflux symptoms that persist despite                            appropriate therapy - throat burning , intermittent                            and short-lived, non-cardiac we think. Relieved by                            tums, not helped by PPI. Medicines:                Monitored Anesthesia Care Procedure:                Pre-Anesthesia Assessment:                           - Prior to the procedure, a History and Physical                            was performed, and patient medications and                            allergies were reviewed. The patient's tolerance of                            previous anesthesia was also reviewed. The risks                            and benefits of the procedure and the sedation                            options and risks were discussed with the patient.                            All questions were answered, and informed consent                            was obtained. Prior Anticoagulants: The patient has                            taken no anticoagulant or antiplatelet agents. ASA                            Grade Assessment: II - A patient with mild systemic                            disease. After reviewing the risks and benefits,  the patient was deemed in satisfactory condition to                            undergo the procedure.                           After obtaining informed consent, the endoscope was                            passed under direct vision. Throughout the                            procedure, the patient's blood pressure, pulse, and                            oxygen saturations were  monitored continuously. The                            GIF [id number] #[id number] was introduced through the                            mouth, and advanced to the second part of duodenum.                            The upper GI endoscopy was accomplished without                            difficulty. The patient tolerated the procedure                            well. Scope In: Scope Out: Findings:                 The Z-line was irregular and was found at the                            gastroesophageal junction. Biopsies were taken with                            a cold forceps for histology here and more                            proximal. Verification of patient identification                            for the specimen was done. Estimated blood loss was                            minimal.                           Diffuse moderately erythematous mucosa without                            bleeding was found in the entire examined stomach.  Biopsies were taken with a cold forceps for                            histology. Verification of patient identification                            for the specimen was done. Estimated blood loss was                            minimal.                           The gastroesophageal flap valve was visualized                            endoscopically and classified as Hill Grade II                            (fold present, opens with respiration).                           A small diverticulum was found in the second                            portion of the duodenum.                           The cardia and gastric fundus were normal on                            retroflexion.                           The exam was otherwise without abnormality. Complications:            No immediate complications. Estimated Blood Loss:     Estimated blood loss was minimal. Impression:               - Z-line irregular, at the gastroesophageal                             junction. Biopsied here and distal esophagus above.                            No columnar change > 1 cm                           - Erythematous mucosa in the stomach. Biopsied.                            Sydney protocol - antral and body jars                           - Gastroesophageal flap valve classified as Hill  Grade II (fold present, opens with respiration).                           - The examination was otherwise normal. Recommendation:           - Patient has a contact number available for                            emergencies. The signs and symptoms of potential                            delayed complications were discussed with the                            patient. Return to normal activities tomorrow.                            Written discharge instructions were provided to the                            patient.                           - Resume previous diet.                           - Continue present medications.                           - Await pathology results.                           - If pathology does not explain her throat burning                            symptoms I think would need functional reflux                            testing with Bravo probe vs impedance testing but                            wouuld do off PPI (she is on currently).                           She may continue prn antacids, I have also                            recommended she take Gaviscon at bedtime and                            elevate HOB.                           ADDENDUMl: in recovery husband said he had similar  symptoms from H pylori that was difficult to                            diagnose and took multiple treatment regimens to                            erdicate. Lupita FORBES Commander, MD 08/13/2024 10:27:24 AM This report has been signed electronically.

## 2024-08-13 NOTE — Progress Notes (Signed)
 Pt does not take the protonix  consistently.

## 2024-08-14 ENCOUNTER — Telehealth: Payer: Self-pay

## 2024-08-14 NOTE — Telephone Encounter (Signed)
 Left message

## 2024-08-18 ENCOUNTER — Ambulatory Visit: Payer: Self-pay | Admitting: Internal Medicine

## 2024-08-18 LAB — SURGICAL PATHOLOGY

## 2024-08-18 NOTE — Telephone Encounter (Signed)
 Spoke to patient reviewed pathology that showed gastritis but no H. pylori and there is no reflux esophagitis, the irregular Z-line is in extension of gastric mucosa.  There was a focus of intestinal metaplasia but there are no columnar changes greater than 1 cm so she does not have Barrett's esophagus.   She reports that things are not as severe as they were we talked about whether or not to go ahead with testing for reflux, with pH probe versus manometry/impedance plus pH.  She feels comfortable with treating as needed with antacids and Gaviscon at bedtime.  She will continue that if that fails to adequately control her symptoms we will regroup.

## 2024-09-09 ENCOUNTER — Other Ambulatory Visit: Payer: Self-pay | Admitting: Internal Medicine

## 2024-09-09 DIAGNOSIS — Z122 Encounter for screening for malignant neoplasm of respiratory organs: Secondary | ICD-10-CM

## 2024-09-23 ENCOUNTER — Ambulatory Visit
Admission: RE | Admit: 2024-09-23 | Discharge: 2024-09-23 | Disposition: A | Discharge status: Home / Self Care | Source: Ambulatory Visit | Attending: Internal Medicine | Admitting: Internal Medicine

## 2024-09-23 DIAGNOSIS — Z122 Encounter for screening for malignant neoplasm of respiratory organs: Secondary | ICD-10-CM

## 2024-10-06 ENCOUNTER — Other Ambulatory Visit (HOSPITAL_COMMUNITY): Payer: Self-pay | Admitting: Internal Medicine

## 2024-10-06 DIAGNOSIS — Q2579 Other congenital malformations of pulmonary artery: Secondary | ICD-10-CM

## 2024-10-08 ENCOUNTER — Ambulatory Visit (HOSPITAL_COMMUNITY)
Admission: RE | Admit: 2024-10-08 | Discharge: 2024-10-08 | Disposition: A | Discharge status: Home / Self Care | Source: Ambulatory Visit | Attending: Cardiology | Admitting: Cardiology

## 2024-10-08 DIAGNOSIS — Q2579 Other congenital malformations of pulmonary artery: Secondary | ICD-10-CM | POA: Diagnosis present

## 2024-10-08 LAB — ECHOCARDIOGRAM COMPLETE
Area-P 1/2: 2.96 cm2
S' Lateral: 3.5 cm

## 2024-10-18 ENCOUNTER — Other Ambulatory Visit: Payer: Self-pay | Admitting: Gastroenterology

## 2024-11-05 ENCOUNTER — Ambulatory Visit (HOSPITAL_COMMUNITY)

## 2024-11-07 ENCOUNTER — Telehealth (HOSPITAL_COMMUNITY): Payer: Self-pay | Admitting: Radiology

## 2024-11-07 NOTE — Telephone Encounter (Signed)
 Spoke with Dr. Dwight regarding echocardiogram report. She was seeking an explanation for PAH. I also spoke with Dr. Ren Ny, reading cardiologist, normal PA pressures noted on echocardiogram report.

## 2024-11-16 NOTE — Progress Notes (Unsigned)
 Cardiology Office Note:    Date:  11/17/2024   ID:  Sonia Robertson, DOB 1956/06/23, MRN [id number]   Pt presents for follow up of CAD    History of Present Illness:    Sonia Robertson is a 68 y.o. female with a hx of CAD, HTN, HL, tobacco abuse   1987  MI  Underwent PCI to diagonal, 2022:  CCTA mild to mod CAD   April 2025   Myoview  She was last seen by GORMAN Ferrier in May 2025  Oct 2025  The pt had a  CT chest for Ca screen that showed a larger pulm artery   Went on to have an echo in Nov 2025   LVEF and RVEF normal   The pt has GERD  EGD this year showed esophagitis    Symptoms of reflux mimic angina   On Protonix    Has TUMS prn Pt has been active through summer   No change in abiltity to do things   Active outside when weather is warmer   Past Medical History:  Diagnosis Date   Adopted    pt is adopted   Angina    NONE SINCE MARCH   SEES DR H SMITH  -LOV 6'17 Epic.-, 9-8-17remains with sporadic episodes of chest pain relived with NTG use_ Dr. Claudene aware.   Arthritis    thinks arthritis in neck   Fibroid    HSV-1 infection    Hyperlipidemia    Hypertension    Hypothyroidism    Myocardial infarction (HCC)    1988   Shortness of breath    WITH EXERTION   Thyroid  disease     Past Surgical History:  Procedure Laterality Date   ABDOMINAL HYSTERECTOMY  2004   TAH BSO   BACK SURGERY     fusion lumbar   CARDIAC CATHETERIZATION     COLONOSCOPY WITH PROPOFOL  N/A 08/14/2016   Procedure: COLONOSCOPY WITH PROPOFOL ;  Surgeon: Gladis MARLA Louder, MD;  Location: [facility];  Service: Endoscopy;  Laterality: N/A;   KNEE SURGERY     Arthroscopic   OOPHORECTOMY     BSO    Current Medications: Current Meds  Medication Sig   albuterol  (VENTOLIN  HFA) 108 (90 Base) MCG/ACT inhaler Inhale into the lungs.   amLODipine  (NORVASC ) 10 MG tablet Take 10 mg by mouth daily.   Ascorbic Acid (VITAMIN C PO) Take by mouth.   aspirin EC 81 MG tablet Take 1 tablet (81 mg total) by mouth  daily.   atenolol  (TENORMIN ) 25 MG tablet Take 25 mg by mouth daily. Takes 1/2 tablet(12.5 mg) daily AM/ 1/2 tablet (12.5 mg) daily PM = 25 mg daily total   atorvastatin  (LIPITOR) 40 MG tablet TAKE 1 TABLET BY MOUTH EVERY DAY   Coenzyme Q10 (COQ10) 200 MG CAPS Take 1 capsule by mouth at bedtime.    ibuprofen (ADVIL,MOTRIN) 200 MG tablet Take 800 mg by mouth every 8 (eight) hours as needed. Pain/ swelling.  Only if she has not taken Diclofenac.   isosorbide  mononitrate (IMDUR ) 60 MG 24 hr tablet TAKE 1 TABLET(60 MG) BY MOUTH DAILY   levothyroxine  (SYNTHROID ) 112 MCG tablet Take 112 mcg by mouth daily before breakfast.   Multiple Vitamin (MULTIVITAMIN WITH MINERALS) TABS Take 1 tablet by mouth daily.   nitroGLYCERIN  (NITROSTAT ) 0.4 MG SL tablet Place 1 tablet (0.4 mg total) under the tongue every 5 (five) minutes as needed for chest pain.   pantoprazole  (PROTONIX ) 40 MG tablet TAKE 1 TABLET(40  MG) BY MOUTH DAILY   Vitamin D, Cholecalciferol, 1000 UNITS TABS Take 1 tablet by mouth daily at 6 (six) AM.   Zinc 15 MG CAPS Take 1 capsule by mouth daily.     Allergies:   Morphine   Social History   Socioeconomic History   Marital status: Married    Spouse name: Not on file   Number of children: Not on file   Years of education: Not on file   Highest education level: Not on file  Occupational History   Not on file  Tobacco Use   Smoking status: Every Day    Current packs/day: 0.50    Types: Cigarettes   Smokeless tobacco: Never  Vaping Use   Vaping status: Never Used  Substance and Sexual Activity   Alcohol use: No    Alcohol/week: 0.0 standard drinks of alcohol   Drug use: No   Sexual activity: Yes    Partners: Male    Birth control/protection: Surgical    Comment: 1ST INTERCOURSE- 13, PARTNERS- GREATER THAN 5  Other Topics Concern   Not on file  Social History Narrative   Not on file   Social Drivers of Health   Tobacco Use: High Risk (08/13/2024)   Patient History     Smoking Tobacco Use: Every Day    Smokeless Tobacco Use: Never    Passive Exposure: Not on file  Financial Resource Strain: Not on file  Food Insecurity: Not on file  Transportation Needs: Not on file  Physical Activity: Not on file  Stress: Not on file  Social Connections: Not on file  Depression (PHQ2-9): Low Risk (02/21/2024)   Depression (PHQ2-9)    PHQ-2 Score: 0  Alcohol Screen: Not on file  Housing: Not on file  Utilities: Not on file  Health Literacy: Not on file     Family History: The patient's family history includes Diabetes in her daughter; Hypertension in her mother. She was adopted.  ROS:   Please see the history of present illness.    Low back discomfort.  Continues to smoke cigarettes.  Left parasternal/lower sternal tenderness.  All other systems reviewed and are negative.  EKGs/Labs/Other Studies Reviewed:    The following studies were reviewed today:  Nov 2025  Echo    1. Left ventricular ejection fraction, by estimation, is 60 to 65%. Left  ventricular ejection fraction by 3D volume is 65 %. The left ventricle has  normal function. The left ventricle has no regional wall motion  abnormalities. There is moderate  concentric left ventricular hypertrophy. Left ventricular diastolic  parameters were normal. The average left ventricular global longitudinal  strain is -20.5 %. The global longitudinal strain is normal.   2. Right ventricular systolic function is normal. The right ventricular  size is normal.   3. The mitral valve is normal in structure. Mild mitral valve  regurgitation. No evidence of mitral stenosis.   4. The aortic valve is normal in structure. Aortic valve regurgitation is  not visualized. No aortic stenosis is present.   5. The inferior vena cava is normal in size with greater than 50%  respiratory variability, suggesting right atrial pressure of 3 mmHg.   May 2025  Myoview     The study is normal. The study is low risk.   0.5 mm of  down sloping ST depression in the inferolateral leads (II, III, aVF, V5 and V6) was noted.   LV perfusion is normal. There is no evidence of ischemia. There is no  evidence of infarction.   Left ventricular function is normal. End diastolic cavity size is normal. End systolic cavity size is normal.   Prior study available for comparison from 07/02/2020. Previously described apical perfusion defect is no longer present   Electronically signed by Jerel Balding, MD   Low risk stress nuclear study with normal perfusion and normal left ventricular regional and global systolic function.  Coronary CTA with FFR 02/2021: IMPRESSION:  Coronary arteries: Normal coronary origins.  Right dominance.  -Right Coronary Artery: Minimal mixed atherosclerotic plaque in the proximal RPDA, <25% stenosis.  -Left Main Coronary Artery: Minimal mixed atherosclerotic plaque in the proximal left main coronary artery, <25% stenosis.  -Left Anterior Descending Coronary Artery: Mild mixed atherosclerotic plaque in the mid LAD, 25-49% stenosis. Minimal atherosclerotic plaque in the ostial-proximal first diagonal branch, <25% stenosis. Small caliber ramus intermedius is patent.  -Left Circumflex Artery: Mild mixed atherosclerotic plaque in proximal OM2, 25-49% stenosis. Tiny caliber OM1 is not well seen. Distal LCx is small caliber.  -Coronary calcium  score is 53, which places the patient in the 72nd percentile for age and sex matched control. -Mild dilation of main pulmonary artery, 32 mm, may indicate pulmonary hypertension.   EKG:  EKG not done   Recent Labs: No results found for requested labs within last 365 days.  Recent Lipid Panel    Component Value Date/Time   CHOL 121 07/02/2020 0739   TRIG 71 07/02/2020 0739   HDL 43 07/02/2020 0739   CHOLHDL 2.8 07/02/2020 0739   CHOLHDL 4 03/10/2015 1718   VLDL 13.6 03/10/2015 1718   LDLCALC 63 07/02/2020 0739   LDLDIRECT 68.6 06/16/2014 0830    Physical  Exam:    VS:  BP 130/80 (BP Location: Right Arm, Patient Position: Sitting, Cuff Size: Normal)   Pulse (!) 59   Ht 5' 7 (1.702 m)   Wt 157 lb (71.2 kg)   SpO2 97%   BMI 24.59 kg/m     BP 126/84   on my check  Wt Readings from Last 3 Encounters:  11/17/24 157 lb (71.2 kg)  08/13/24 154 lb (69.9 kg)  07/30/24 154 lb (69.9 kg)     GEN: thin 68 yo in NAD  HEENT: Normal NECK: No JVD.  No bruits CARDIAC: RRR  No S3  No murmurs     RESPIRATORY:  Clear to auscultation   SOme decreased airflow  ABDOMEN: Soft, No masses    No hepatomegaly   Ext  No LE edema   2+ PT pulses   PLAN:    1  CAD  Remote intervention to diagonal (1987)  Has esophagitis which makes things difficult    I am not convinced of any active angina  Follow   2  HTN  BP is controlled   Follow   3 HL  LDL 69  HDL 40  Trig 73   Keep on atorvastatin    4  Metabolic  Reviewed diet   A1C 5.3    5  Tobacco  Counselled again on cessation   Follow up in 1 year   Medication Adjustments/Labs and Tests Ordered: Current medicines are reviewed at length with the patient today.  Concerns regarding medicines are outlined above.  No orders of the defined types were placed in this encounter.  No orders of the defined types were placed in this encounter.   Patient Instructions  Medication Instructions:  Your physician recommends that you continue on your current medications as directed. Please refer to  the Current Medication list given to you today.  *If you need a refill on your cardiac medications before your next appointment, please call your pharmacy*  Lab Work: none If you have labs (blood work) drawn today and your tests are completely normal, you will receive your results only by: MyChart Message (if you have MyChart) OR A paper copy in the mail If you have any lab test that is abnormal or we need to change your treatment, we will call you to review the results.  Testing/Procedures: none  Follow-Up: At  [facility], you and your health needs are our priority.  As part of our continuing mission to provide you with exceptional heart care, our providers are all part of one team.  This team includes your primary Cardiologist (physician) and Advanced Practice Providers or APPs (Physician Assistants and Nurse Practitioners) who all work together to provide you with the care you need, when you need it.  Your next appointment:   9 month(s)  Provider:   Vina Gull, MD    We recommend signing up for the patient portal called MyChart.  Sign up information is provided on this After Visit Summary.  MyChart is used to connect with patients for Virtual Visits (Telemedicine).  Patients are able to view lab/test results, encounter notes, upcoming appointments, etc.  Non-urgent messages can be sent to your provider as well.   To learn more about what you can do with MyChart, go to [email or web address].   Other Instructions none          Signed, Vina Gull, MD  11/17/2024 8:34 AM    [facility]

## 2024-11-17 ENCOUNTER — Encounter: Payer: Self-pay | Admitting: Internal Medicine

## 2024-11-17 ENCOUNTER — Ambulatory Visit: Attending: Internal Medicine | Admitting: Internal Medicine

## 2024-11-17 VITALS — BP 130/80 | HR 59 | Ht 67.0 in | Wt 157.0 lb

## 2024-11-17 DIAGNOSIS — I2510 Atherosclerotic heart disease of native coronary artery without angina pectoris: Secondary | ICD-10-CM

## 2024-11-17 NOTE — Patient Instructions (Addendum)
 Medication Instructions:  Your physician recommends that you continue on your current medications as directed. Please refer to the Current Medication list given to you today.  *If you need a refill on your cardiac medications before your next appointment, please call your pharmacy*  Lab Work: none If you have labs (blood work) drawn today and your tests are completely normal, you will receive your results only by: MyChart Message (if you have MyChart) OR A paper copy in the mail If you have any lab test that is abnormal or we need to change your treatment, we will call you to review the results.  Testing/Procedures: none  Follow-Up: At [facility], you and your health needs are our priority.  As part of our continuing mission to provide you with exceptional heart care, our providers are all part of one team.  This team includes your primary Cardiologist (physician) and Advanced Practice Providers or APPs (Physician Assistants and Nurse Practitioners) who all work together to provide you with the care you need, when you need it.  Your next appointment:   1 year  Provider:   Vina Gull, MD    We recommend signing up for the patient portal called MyChart.  Sign up information is provided on this After Visit Summary.  MyChart is used to connect with patients for Virtual Visits (Telemedicine).  Patients are able to view lab/test results, encounter notes, upcoming appointments, etc.  Non-urgent messages can be sent to your provider as well.   To learn more about what you can do with MyChart, go to [email or web address].   Other Instructions none

## 2025-02-24 ENCOUNTER — Encounter: Admitting: Radiology
